# Patient Record
Sex: Male | Born: 1955 | Race: Black or African American | Hispanic: No | Marital: Married | Smoking: Former smoker
Health system: Southern US, Community
[De-identification: ages and names within clinical notes are randomized; demographics above are authoritative.]

## PROBLEM LIST (undated history)

## (undated) DIAGNOSIS — M549 Dorsalgia, unspecified: Secondary | ICD-10-CM

## (undated) DIAGNOSIS — G8929 Other chronic pain: Secondary | ICD-10-CM

## (undated) DIAGNOSIS — K259 Gastric ulcer, unspecified as acute or chronic, without hemorrhage or perforation: Secondary | ICD-10-CM

## (undated) DIAGNOSIS — R51 Headache: Secondary | ICD-10-CM

## (undated) DIAGNOSIS — R0600 Dyspnea, unspecified: Secondary | ICD-10-CM

## (undated) DIAGNOSIS — R519 Headache, unspecified: Secondary | ICD-10-CM

## (undated) DIAGNOSIS — I639 Cerebral infarction, unspecified: Secondary | ICD-10-CM

## (undated) DIAGNOSIS — N400 Enlarged prostate without lower urinary tract symptoms: Secondary | ICD-10-CM

## (undated) DIAGNOSIS — R569 Unspecified convulsions: Secondary | ICD-10-CM

## (undated) DIAGNOSIS — I10 Essential (primary) hypertension: Secondary | ICD-10-CM

## (undated) DIAGNOSIS — I509 Heart failure, unspecified: Secondary | ICD-10-CM

## (undated) HISTORY — PX: BACK SURGERY: SHX140

## (undated) HISTORY — DX: Other chronic pain: G89.29

## (undated) HISTORY — DX: Unspecified convulsions: R56.9

## (undated) HISTORY — DX: Headache, unspecified: R51.9

## (undated) HISTORY — DX: Headache: R51

---

## 2010-04-27 ENCOUNTER — Ambulatory Visit (HOSPITAL_COMMUNITY): Admission: RE | Admit: 2010-04-27 | Discharge: 2010-04-27 | Payer: Self-pay | Admitting: Urology

## 2013-08-08 HISTORY — PX: COLONOSCOPY: SHX174

## 2013-08-23 ENCOUNTER — Emergency Department (HOSPITAL_COMMUNITY)
Admission: EM | Admit: 2013-08-23 | Discharge: 2013-08-23 | Discharge status: Against Medical Advice | Attending: Emergency Medicine | Admitting: Emergency Medicine

## 2013-08-23 ENCOUNTER — Encounter (HOSPITAL_COMMUNITY): Payer: Self-pay | Admitting: Emergency Medicine

## 2013-08-23 DIAGNOSIS — K5900 Constipation, unspecified: Secondary | ICD-10-CM | POA: Insufficient documentation

## 2013-08-23 DIAGNOSIS — I10 Essential (primary) hypertension: Secondary | ICD-10-CM | POA: Insufficient documentation

## 2013-08-23 HISTORY — DX: Essential (primary) hypertension: I10

## 2013-08-23 HISTORY — DX: Dorsalgia, unspecified: M54.9

## 2013-08-23 HISTORY — DX: Cerebral infarction, unspecified: I63.9

## 2013-08-23 HISTORY — DX: Other chronic pain: G89.29

## 2013-08-23 HISTORY — DX: Gastric ulcer, unspecified as acute or chronic, without hemorrhage or perforation: K25.9

## 2013-08-23 HISTORY — DX: Benign prostatic hyperplasia without lower urinary tract symptoms: N40.0

## 2013-08-23 NOTE — ED Notes (Signed)
Pt reports constipation since Sunday. [facility] a [facility]

## 2013-08-23 NOTE — ED Notes (Addendum)
PT JUST HAD BM TONIGHT WHILE WAITING FOR A ROOM. PT NOW NOT WANTING TO BE SEEN.

## 2016-01-18 DIAGNOSIS — G932 Benign intracranial hypertension: Secondary | ICD-10-CM | POA: Insufficient documentation

## 2016-01-25 ENCOUNTER — Other Ambulatory Visit: Payer: Self-pay

## 2016-01-25 ENCOUNTER — Emergency Department (HOSPITAL_BASED_OUTPATIENT_CLINIC_OR_DEPARTMENT_OTHER)
Admission: EM | Admit: 2016-01-25 | Discharge: 2016-01-25 | Disposition: A | Discharge status: Home / Self Care | Payer: Medicare Other | Attending: Emergency Medicine | Admitting: Emergency Medicine

## 2016-01-25 ENCOUNTER — Emergency Department (HOSPITAL_BASED_OUTPATIENT_CLINIC_OR_DEPARTMENT_OTHER): Payer: Medicare Other

## 2016-01-25 ENCOUNTER — Encounter (HOSPITAL_BASED_OUTPATIENT_CLINIC_OR_DEPARTMENT_OTHER): Payer: Self-pay | Admitting: Emergency Medicine

## 2016-01-25 DIAGNOSIS — J019 Acute sinusitis, unspecified: Secondary | ICD-10-CM | POA: Diagnosis not present

## 2016-01-25 DIAGNOSIS — M79602 Pain in left arm: Secondary | ICD-10-CM | POA: Insufficient documentation

## 2016-01-25 DIAGNOSIS — Z79899 Other long term (current) drug therapy: Secondary | ICD-10-CM | POA: Insufficient documentation

## 2016-01-25 DIAGNOSIS — F17200 Nicotine dependence, unspecified, uncomplicated: Secondary | ICD-10-CM | POA: Diagnosis not present

## 2016-01-25 DIAGNOSIS — R200 Anesthesia of skin: Secondary | ICD-10-CM | POA: Diagnosis not present

## 2016-01-25 DIAGNOSIS — I10 Essential (primary) hypertension: Secondary | ICD-10-CM | POA: Insufficient documentation

## 2016-01-25 DIAGNOSIS — R05 Cough: Secondary | ICD-10-CM | POA: Diagnosis present

## 2016-01-25 LAB — BASIC METABOLIC PANEL
ANION GAP: 9 (ref 5–15)
BUN: 19 mg/dL (ref 6–20)
CALCIUM: 8.9 mg/dL (ref 8.9–10.3)
CO2: 26 mmol/L (ref 22–32)
CREATININE: 1.38 mg/dL — AB (ref 0.61–1.24)
Chloride: 104 mmol/L (ref 101–111)
GFR calc Af Amer: >60 mL/min (ref >60)
GFR calc non Af Amer: 55 mL/min — ABNORMAL LOW (ref >60)
GLUCOSE: 96 mg/dL (ref 65–99)
Potassium: 3.6 mmol/L (ref 3.5–5.1)
Sodium: 139 mmol/L (ref 135–145)

## 2016-01-25 LAB — CBC
HCT: 41.3 % (ref 39.0–52.0)
HEMOGLOBIN: 14 g/dL (ref 13.0–17.0)
MCH: 26.7 pg (ref 26.0–34.0)
MCHC: 33.9 g/dL (ref 30.0–36.0)
MCV: 78.8 fL (ref 78.0–100.0)
Platelets: 280 10*3/uL (ref 150–400)
RBC: 5.24 MIL/uL (ref 4.22–5.81)
RDW: 14.6 % (ref 11.5–15.5)
WBC: 8.2 10*3/uL (ref 4.0–10.5)

## 2016-01-25 LAB — TROPONIN I

## 2016-01-25 MED ORDER — DEXAMETHASONE 6 MG PO TABS
10.0000 mg | ORAL_TABLET | Freq: Once | ORAL | Status: AC
  Administered 2016-01-25: 10 mg via ORAL
  Filled 2016-01-25: qty 1

## 2016-01-25 MED ORDER — AZITHROMYCIN 250 MG PO TABS: 250.0000 mg | ORAL_TABLET | Freq: Every day | ORAL | Status: DC

## 2016-01-25 NOTE — Discharge Instructions (Signed)

## 2016-01-25 NOTE — ED Notes (Signed)
Pt c/o left arm pain that radiates into jaw that started today.  C/o sore throat for 2-3 weeks

## 2016-01-25 NOTE — ED Provider Notes (Signed)
CSN: [id number]     Arrival date & time 01/25/16  2043 History  By signing my name below, I, Rowan Blase, attest that this documentation has been prepared under the direction and in the presence of Leo Grosser, MD . Electronically Signed: Rowan Blase, Scribe. 01/25/2016. 10:11 PM.   Chief Complaint  Patient presents with  . Arm Pain    The history is provided by the patient. No language interpreter was used.   HPI Comments:  Donal Rohal is a 60 y.o. male with PMHx of HTN and stroke who presents to the Emergency Department complaining of left arm pain onset this morning. Pt reports associated tingling in left hand, left ear pain worsening with swallowing, sore throat, congestion and cough productive of green mucous. He notes difficulty breathing, worse when laying down. Pt has taken generic Zyrtec, Robitussin PM, Alka Seltzer Plus and Flonase. Wife notes pt's snoring has worsened recently. Pt smokes 5-6 cigarettes a day and states he is trying to quit.   Past Medical History  Diagnosis Date  . Stroke ([place])   . Hypertension   . Prostate enlargement   . Chronic back pain   . Gastric ulcer    History reviewed. No pertinent past surgical history. History reviewed. No pertinent family history. Social History  Substance Use Topics  . Smoking status: Current Every Day Smoker  . Smokeless tobacco: None  . Alcohol Use: No    Review of Systems  HENT: Positive for congestion, ear pain and sore throat.   Respiratory: Positive for cough and shortness of breath.   Musculoskeletal: Positive for arthralgias.  Neurological: Positive for numbness (tingling).  All other systems reviewed and are negative.   Allergies  Penicillins  Home Medications   Prior to Admission medications   Medication Sig Start Date End Date Taking? Authorizing Provider  acetaZOLAMIDE (DIAMOX) 500 MG capsule Take 500 mg by mouth 2 (two) times daily.   Yes Historical Provider, MD  amLODipine (NORVASC) 5  MG tablet Take 5 mg by mouth daily.   Yes Historical Provider, MD  atorvastatin (LIPITOR) 20 MG tablet Take 20 mg by mouth daily.   Yes Historical Provider, MD  Butalbital-APAP-Caffeine (FIORICET) 50-300-40 MG CAPS Take by mouth.   Yes Historical Provider, MD  fluticasone (FLONASE) 50 MCG/ACT nasal spray Place into both nostrils daily.   Yes Historical Provider, MD  hydrOXYzine (ATARAX/VISTARIL) 25 MG tablet Take by mouth 3 (three) times daily as needed.   Yes Historical Provider, MD  nortriptyline (PAMELOR) 10 MG capsule Take 10 mg by mouth at bedtime.   Yes Historical Provider, MD  tamsulosin (FLOMAX) 0.4 MG CAPS capsule Take 0.4 mg by mouth.   Yes Historical Provider, MD  triamterene-hydrochlorothiazide (DYAZIDE) 37.5-25 MG capsule Take 1 capsule by mouth daily.   Yes Historical Provider, MD   BP 191/122 mmHg  Pulse 90  Temp(Src) 98.6 F (37 C) (Oral)  Resp 18  Ht 5' 8.5" (1.74 m)  Wt 218 lb (98.884 kg)  BMI 32.66 kg/m2  SpO2 98%   Physical Exam  Constitutional: He is oriented to person, place, and time. He appears well-developed and well-nourished. No distress.  HENT:  Head: Normocephalic and atraumatic.  Eyes: Conjunctivae are normal.  Neck: Neck supple. No tracheal deviation present.  Cardiovascular: Normal rate, regular rhythm and normal heart sounds.  Exam reveals no gallop and no friction rub.   No murmur heard. Pulmonary/Chest: Effort normal and breath sounds normal. No respiratory distress. He has no wheezes. He has no  rales.  Abdominal: Soft. He exhibits no distension.  Musculoskeletal:  Left arm pain is positional in nature.  Neurological: He is alert and oriented to person, place, and time.  Full strength and sensation BL  Skin: Skin is warm and dry.  Psychiatric: He has a normal mood and affect.    ED Course  Procedures  DIAGNOSTIC STUDIES:  Oxygen Saturation is 98% on RA, normal by my interpretation.    COORDINATION OF CARE:  10:03 PM Will administer  steroids and discharge with Z-pack. Follow-up with PCP in 1 week. Discussed treatment plan with pt at bedside and pt agreed to plan.  Labs Review Labs Reviewed  BASIC METABOLIC PANEL - Abnormal; Notable for the following:    Creatinine, Ser 1.38 (*)    GFR calc non Af Amer 55 (*)    All other components within normal limits  CBC  TROPONIN I    Imaging Review Dg Chest 2 View  01/25/2016  CLINICAL DATA:  Acute onset of left arm pain. Sore throat and cough. Bilateral ear and jaw pain. Initial encounter. EXAM: CHEST  2 VIEW COMPARISON:  CTA of the chest and chest radiograph performed 10/21/2012 FINDINGS: The lungs are well-aerated. Mild bibasilar opacities may reflect atelectasis or possibly mild pneumonia. There is no evidence of pleural effusion or pneumothorax. The heart is normal in size; the mediastinal contour is within normal limits. No acute osseous abnormalities are seen. IMPRESSION: Mild bibasilar airspace opacities may reflect atelectasis or possibly mild pneumonia. Electronically Signed   By: Garald Balding M.D.   On: 01/25/2016 21:41   I have personally reviewed and evaluated these images and lab results as part of my medical decision-making.   EKG Interpretation   Date/Time:  Monday January 25 2016 20:52:45 EDT Ventricular Rate:  88 PR Interval:  174 QRS Duration: 90 QT Interval:  354 QTC Calculation: 428 R Axis:   -7 Text Interpretation:  Normal sinus rhythm Normal ECG No previous tracing  Confirmed by Nickolis Diel MD, Quillian Quince [id number]) on 01/25/2016 8:54:43 PM      MDM   Final diagnoses:  Acute sinusitis, recurrence not specified, unspecified location    60 y.o. male presents with multiple complaints the most pressing of which is his sinus congestion that has been refractory to antihistamines and steroid nasal spray. It is interfering with his sleep and his post-nasal drip is causing him a great deal of throat discomfort. Pt given decadron for symptomatic relief. Will plan for  azithro with ongoing cough in a smoker and questionable XR findings of atypical pneumonia. His arm pain appears MSK in nature over the triceps and screening cardiac workup from triage is negative, highly atypical for ACS. Advised that smoking cessation would be highly beneficial. Plan to follow up with PCP as needed and return precautions discussed for worsening or new concerning symptoms.   I personally performed the services described in this documentation, which was scribed in my presence. The recorded information has been reviewed and is accurate.     Leo Grosser, MD 01/26/16 [phone]

## 2017-04-19 DIAGNOSIS — G459 Transient cerebral ischemic attack, unspecified: Secondary | ICD-10-CM | POA: Insufficient documentation

## 2017-04-19 DIAGNOSIS — Z8669 Personal history of other diseases of the nervous system and sense organs: Secondary | ICD-10-CM | POA: Insufficient documentation

## 2017-05-10 DIAGNOSIS — R002 Palpitations: Secondary | ICD-10-CM | POA: Insufficient documentation

## 2017-12-31 ENCOUNTER — Emergency Department (HOSPITAL_BASED_OUTPATIENT_CLINIC_OR_DEPARTMENT_OTHER): Payer: Medicare Other

## 2017-12-31 ENCOUNTER — Inpatient Hospital Stay (HOSPITAL_BASED_OUTPATIENT_CLINIC_OR_DEPARTMENT_OTHER)
Admission: EM | Admit: 2017-12-31 | Discharge: 2018-01-03 | DRG: 291 | Disposition: A | Discharge status: Home / Self Care | Payer: Medicare Other | Attending: Internal Medicine | Admitting: Internal Medicine

## 2017-12-31 ENCOUNTER — Inpatient Hospital Stay (HOSPITAL_COMMUNITY): Payer: Medicare Other

## 2017-12-31 ENCOUNTER — Encounter (HOSPITAL_BASED_OUTPATIENT_CLINIC_OR_DEPARTMENT_OTHER): Payer: Self-pay | Admitting: *Deleted

## 2017-12-31 ENCOUNTER — Other Ambulatory Visit: Payer: Self-pay

## 2017-12-31 DIAGNOSIS — N179 Acute kidney failure, unspecified: Secondary | ICD-10-CM | POA: Diagnosis not present

## 2017-12-31 DIAGNOSIS — Z9981 Dependence on supplemental oxygen: Secondary | ICD-10-CM | POA: Diagnosis not present

## 2017-12-31 DIAGNOSIS — Z79899 Other long term (current) drug therapy: Secondary | ICD-10-CM

## 2017-12-31 DIAGNOSIS — I427 Cardiomyopathy due to drug and external agent: Secondary | ICD-10-CM | POA: Diagnosis present

## 2017-12-31 DIAGNOSIS — H93A9 Pulsatile tinnitus, unspecified ear: Secondary | ICD-10-CM | POA: Diagnosis present

## 2017-12-31 DIAGNOSIS — D1809 Hemangioma of other sites: Secondary | ICD-10-CM | POA: Diagnosis present

## 2017-12-31 DIAGNOSIS — I5021 Acute systolic (congestive) heart failure: Secondary | ICD-10-CM | POA: Diagnosis present

## 2017-12-31 DIAGNOSIS — N289 Disorder of kidney and ureter, unspecified: Secondary | ICD-10-CM

## 2017-12-31 DIAGNOSIS — M549 Dorsalgia, unspecified: Secondary | ICD-10-CM | POA: Diagnosis present

## 2017-12-31 DIAGNOSIS — K761 Chronic passive congestion of liver: Secondary | ICD-10-CM | POA: Diagnosis present

## 2017-12-31 DIAGNOSIS — J439 Emphysema, unspecified: Secondary | ICD-10-CM | POA: Diagnosis present

## 2017-12-31 DIAGNOSIS — I10 Essential (primary) hypertension: Secondary | ICD-10-CM

## 2017-12-31 DIAGNOSIS — Z91041 Radiographic dye allergy status: Secondary | ICD-10-CM

## 2017-12-31 DIAGNOSIS — N183 Chronic kidney disease, stage 3 unspecified: Secondary | ICD-10-CM

## 2017-12-31 DIAGNOSIS — I5041 Acute combined systolic (congestive) and diastolic (congestive) heart failure: Secondary | ICD-10-CM | POA: Diagnosis not present

## 2017-12-31 DIAGNOSIS — E785 Hyperlipidemia, unspecified: Secondary | ICD-10-CM | POA: Diagnosis present

## 2017-12-31 DIAGNOSIS — R740 Nonspecific elevation of levels of transaminase and lactic acid dehydrogenase [LDH]: Secondary | ICD-10-CM | POA: Diagnosis present

## 2017-12-31 DIAGNOSIS — R51 Headache: Secondary | ICD-10-CM | POA: Diagnosis present

## 2017-12-31 DIAGNOSIS — I130 Hypertensive heart and chronic kidney disease with heart failure and stage 1 through stage 4 chronic kidney disease, or unspecified chronic kidney disease: Secondary | ICD-10-CM | POA: Diagnosis present

## 2017-12-31 DIAGNOSIS — N170 Acute kidney failure with tubular necrosis: Secondary | ICD-10-CM | POA: Diagnosis not present

## 2017-12-31 DIAGNOSIS — Z8673 Personal history of transient ischemic attack (TIA), and cerebral infarction without residual deficits: Secondary | ICD-10-CM

## 2017-12-31 DIAGNOSIS — I428 Other cardiomyopathies: Secondary | ICD-10-CM | POA: Diagnosis present

## 2017-12-31 DIAGNOSIS — Z888 Allergy status to other drugs, medicaments and biological substances status: Secondary | ICD-10-CM

## 2017-12-31 DIAGNOSIS — I5020 Unspecified systolic (congestive) heart failure: Secondary | ICD-10-CM

## 2017-12-31 DIAGNOSIS — R7989 Other specified abnormal findings of blood chemistry: Secondary | ICD-10-CM

## 2017-12-31 DIAGNOSIS — R0602 Shortness of breath: Secondary | ICD-10-CM

## 2017-12-31 DIAGNOSIS — Z720 Tobacco use: Secondary | ICD-10-CM | POA: Diagnosis not present

## 2017-12-31 DIAGNOSIS — Z8711 Personal history of peptic ulcer disease: Secondary | ICD-10-CM

## 2017-12-31 DIAGNOSIS — I313 Pericardial effusion (noninflammatory): Secondary | ICD-10-CM | POA: Diagnosis present

## 2017-12-31 DIAGNOSIS — R945 Abnormal results of liver function studies: Secondary | ICD-10-CM | POA: Diagnosis present

## 2017-12-31 DIAGNOSIS — F1910 Other psychoactive substance abuse, uncomplicated: Secondary | ICD-10-CM | POA: Diagnosis present

## 2017-12-31 DIAGNOSIS — R0902 Hypoxemia: Secondary | ICD-10-CM | POA: Diagnosis present

## 2017-12-31 DIAGNOSIS — G8929 Other chronic pain: Secondary | ICD-10-CM | POA: Diagnosis present

## 2017-12-31 DIAGNOSIS — F17200 Nicotine dependence, unspecified, uncomplicated: Secondary | ICD-10-CM | POA: Diagnosis present

## 2017-12-31 DIAGNOSIS — F1410 Cocaine abuse, uncomplicated: Secondary | ICD-10-CM | POA: Diagnosis present

## 2017-12-31 DIAGNOSIS — J9811 Atelectasis: Secondary | ICD-10-CM | POA: Diagnosis present

## 2017-12-31 DIAGNOSIS — N400 Enlarged prostate without lower urinary tract symptoms: Secondary | ICD-10-CM

## 2017-12-31 DIAGNOSIS — R160 Hepatomegaly, not elsewhere classified: Secondary | ICD-10-CM | POA: Diagnosis present

## 2017-12-31 DIAGNOSIS — N184 Chronic kidney disease, stage 4 (severe): Secondary | ICD-10-CM | POA: Diagnosis not present

## 2017-12-31 DIAGNOSIS — I509 Heart failure, unspecified: Secondary | ICD-10-CM | POA: Diagnosis not present

## 2017-12-31 DIAGNOSIS — Z885 Allergy status to narcotic agent status: Secondary | ICD-10-CM

## 2017-12-31 LAB — BASIC METABOLIC PANEL
Anion gap: 10 (ref 5–15)
BUN: 34 mg/dL — ABNORMAL HIGH (ref 6–20)
CALCIUM: 8.5 mg/dL — AB (ref 8.9–10.3)
CO2: 19 mmol/L — ABNORMAL LOW (ref 22–32)
CREATININE: 1.4 mg/dL — AB (ref 0.61–1.24)
Chloride: 111 mmol/L (ref 101–111)
GFR calc Af Amer: >60 mL/min (ref >60)
GFR, EST NON AFRICAN AMERICAN: 54 mL/min — AB (ref >60)
Glucose, Bld: 111 mg/dL — ABNORMAL HIGH (ref 65–99)
Potassium: 3.8 mmol/L (ref 3.5–5.1)
SODIUM: 140 mmol/L (ref 135–145)

## 2017-12-31 LAB — RAPID URINE DRUG SCREEN, HOSP PERFORMED
Amphetamines: NOT DETECTED
Barbiturates: NOT DETECTED
Benzodiazepines: NOT DETECTED
Cocaine: POSITIVE — AB
OPIATES: NOT DETECTED
TETRAHYDROCANNABINOL: NOT DETECTED

## 2017-12-31 LAB — CREATININE, SERUM
CREATININE: 1.56 mg/dL — AB (ref 0.61–1.24)
GFR calc Af Amer: 54 mL/min — ABNORMAL LOW (ref >60)
GFR, EST NON AFRICAN AMERICAN: 47 mL/min — AB (ref >60)

## 2017-12-31 LAB — CBC WITH DIFFERENTIAL/PLATELET
BASOS ABS: 0 10*3/uL (ref 0.0–0.1)
Basophils Relative: 0 %
Eosinophils Absolute: 0.1 10*3/uL (ref 0.0–0.7)
Eosinophils Relative: 2 %
HEMATOCRIT: 38.1 % — AB (ref 39.0–52.0)
HEMOGLOBIN: 13.3 g/dL (ref 13.0–17.0)
LYMPHS PCT: 22 %
Lymphs Abs: 1.1 10*3/uL (ref 0.7–4.0)
MCH: 27.5 pg (ref 26.0–34.0)
MCHC: 34.9 g/dL (ref 30.0–36.0)
MCV: 78.9 fL (ref 78.0–100.0)
MONO ABS: 0.4 10*3/uL (ref 0.1–1.0)
Monocytes Relative: 9 %
NEUTROS ABS: 3.4 10*3/uL (ref 1.7–7.7)
NEUTROS PCT: 67 %
Platelets: 249 10*3/uL (ref 150–400)
RBC: 4.83 MIL/uL (ref 4.22–5.81)
RDW: 14.2 % (ref 11.5–15.5)
WBC: 5.1 10*3/uL (ref 4.0–10.5)

## 2017-12-31 LAB — TROPONIN I
TROPONIN I: 0.03 ng/mL — AB (ref <0.03)
TROPONIN I: 0.03 ng/mL — AB (ref <0.03)
Troponin I: 0.03 ng/mL (ref <0.03)
Troponin I: 0.03 ng/mL (ref <0.03)

## 2017-12-31 LAB — COMPREHENSIVE METABOLIC PANEL
ALT: 71 U/L — ABNORMAL HIGH (ref 17–63)
AST: 44 U/L — ABNORMAL HIGH (ref 15–41)
Albumin: 2.9 g/dL — ABNORMAL LOW (ref 3.5–5.0)
Alkaline Phosphatase: 65 U/L (ref 38–126)
Anion gap: 9 (ref 5–15)
BUN: 31 mg/dL — AB (ref 6–20)
CHLORIDE: 111 mmol/L (ref 101–111)
CO2: 24 mmol/L (ref 22–32)
Calcium: 8.6 mg/dL — ABNORMAL LOW (ref 8.9–10.3)
Creatinine, Ser: 1.69 mg/dL — ABNORMAL HIGH (ref 0.61–1.24)
GFR calc Af Amer: 49 mL/min — ABNORMAL LOW (ref >60)
GFR calc non Af Amer: 43 mL/min — ABNORMAL LOW (ref >60)
GLUCOSE: 101 mg/dL — AB (ref 65–99)
POTASSIUM: 3.7 mmol/L (ref 3.5–5.1)
SODIUM: 144 mmol/L (ref 135–145)
Total Bilirubin: 0.4 mg/dL (ref 0.3–1.2)
Total Protein: 6.2 g/dL — ABNORMAL LOW (ref 6.5–8.1)

## 2017-12-31 LAB — CBC
HCT: 41.3 % (ref 39.0–52.0)
HEMOGLOBIN: 13.7 g/dL (ref 13.0–17.0)
MCH: 27 pg (ref 26.0–34.0)
MCHC: 33.2 g/dL (ref 30.0–36.0)
MCV: 81.5 fL (ref 78.0–100.0)
PLATELETS: 256 10*3/uL (ref 150–400)
RBC: 5.07 MIL/uL (ref 4.22–5.81)
RDW: 14.1 % (ref 11.5–15.5)
WBC: 5.9 10*3/uL (ref 4.0–10.5)

## 2017-12-31 LAB — MAGNESIUM: MAGNESIUM: 2.1 mg/dL (ref 1.7–2.4)

## 2017-12-31 LAB — TSH: TSH: 1.178 u[IU]/mL (ref 0.350–4.500)

## 2017-12-31 LAB — PHOSPHORUS: PHOSPHORUS: 3.9 mg/dL (ref 2.5–4.6)

## 2017-12-31 LAB — BRAIN NATRIURETIC PEPTIDE: B NATRIURETIC PEPTIDE 5: 928 pg/mL — AB (ref 0.0–100.0)

## 2017-12-31 MED ORDER — TAMSULOSIN HCL 0.4 MG PO CAPS
0.4000 mg | ORAL_CAPSULE | Freq: Every day | ORAL | Status: DC
  Administered 2017-12-31 – 2018-01-02 (×3): 0.4 mg via ORAL
  Filled 2017-12-31 (×3): qty 1

## 2017-12-31 MED ORDER — NITROGLYCERIN 2 % TD OINT
1.0000 [in_us] | TOPICAL_OINTMENT | Freq: Once | TRANSDERMAL | Status: AC
  Administered 2017-12-31: 1 [in_us] via TOPICAL
  Filled 2017-12-31: qty 1

## 2017-12-31 MED ORDER — ONDANSETRON HCL 4 MG/2ML IJ SOLN: 4.0000 mg | Freq: Four times a day (QID) | INTRAMUSCULAR | Status: DC | PRN

## 2017-12-31 MED ORDER — ACETAMINOPHEN 325 MG PO TABS
650.0000 mg | ORAL_TABLET | ORAL | Status: DC | PRN
  Administered 2017-12-31 – 2018-01-02 (×2): 650 mg via ORAL
  Filled 2017-12-31 (×2): qty 2

## 2017-12-31 MED ORDER — TRIAMTERENE-HCTZ 37.5-25 MG PO CAPS
1.0000 | ORAL_CAPSULE | Freq: Every day | ORAL | Status: DC
  Filled 2017-12-31: qty 1

## 2017-12-31 MED ORDER — HEPARIN SODIUM (PORCINE) 5000 UNIT/ML IJ SOLN
5000.0000 [IU] | Freq: Three times a day (TID) | INTRAMUSCULAR | Status: DC
  Administered 2017-12-31: 5000 [IU] via SUBCUTANEOUS
  Filled 2017-12-31 (×6): qty 1

## 2017-12-31 MED ORDER — HYDROXYZINE HCL 25 MG PO TABS
25.0000 mg | ORAL_TABLET | Freq: Three times a day (TID) | ORAL | Status: DC | PRN
  Administered 2017-12-31: 25 mg via ORAL
  Filled 2017-12-31: qty 1

## 2017-12-31 MED ORDER — SODIUM CHLORIDE 0.9% FLUSH
3.0000 mL | Freq: Two times a day (BID) | INTRAVENOUS | Status: DC
  Administered 2017-12-31 – 2018-01-03 (×6): 3 mL via INTRAVENOUS

## 2017-12-31 MED ORDER — FUROSEMIDE 10 MG/ML IJ SOLN
40.0000 mg | Freq: Two times a day (BID) | INTRAMUSCULAR | Status: DC
  Administered 2018-01-01 (×2): 40 mg via INTRAVENOUS
  Filled 2017-12-31 (×2): qty 4

## 2017-12-31 MED ORDER — SODIUM CHLORIDE 0.9% FLUSH: 3.0000 mL | INTRAVENOUS | Status: DC | PRN

## 2017-12-31 MED ORDER — NORTRIPTYLINE HCL 10 MG PO CAPS
10.0000 mg | ORAL_CAPSULE | Freq: Every day | ORAL | Status: DC
  Administered 2017-12-31 – 2018-01-02 (×2): 10 mg via ORAL
  Filled 2017-12-31 (×3): qty 1

## 2017-12-31 MED ORDER — NITROGLYCERIN 0.4 MG SL SUBL: 0.4000 mg | SUBLINGUAL_TABLET | SUBLINGUAL | Status: DC | PRN

## 2017-12-31 MED ORDER — ATORVASTATIN CALCIUM 20 MG PO TABS
20.0000 mg | ORAL_TABLET | Freq: Every day | ORAL | Status: DC
  Administered 2017-12-31 – 2018-01-02 (×3): 20 mg via ORAL
  Filled 2017-12-31 (×3): qty 1

## 2017-12-31 MED ORDER — AMLODIPINE BESYLATE 5 MG PO TABS
5.0000 mg | ORAL_TABLET | Freq: Every day | ORAL | Status: DC
  Administered 2017-12-31 – 2018-01-01 (×2): 5 mg via ORAL
  Filled 2017-12-31 (×3): qty 1

## 2017-12-31 MED ORDER — ACETAZOLAMIDE ER 500 MG PO CP12
500.0000 mg | ORAL_CAPSULE | Freq: Two times a day (BID) | ORAL | Status: DC
  Administered 2017-12-31 – 2018-01-01 (×3): 500 mg via ORAL
  Filled 2017-12-31 (×3): qty 1

## 2017-12-31 MED ORDER — FUROSEMIDE 10 MG/ML IJ SOLN
40.0000 mg | Freq: Once | INTRAMUSCULAR | Status: AC
  Administered 2017-12-31: 40 mg via INTRAVENOUS
  Filled 2017-12-31: qty 4

## 2017-12-31 MED ORDER — FLUTICASONE PROPIONATE 50 MCG/ACT NA SUSP
1.0000 | Freq: Every day | NASAL | Status: DC
  Administered 2017-12-31 – 2018-01-02 (×2): 1 via NASAL
  Filled 2017-12-31: qty 16

## 2017-12-31 MED ORDER — SODIUM CHLORIDE 0.9 % IV SOLN: 250.0000 mL | INTRAVENOUS | Status: DC | PRN

## 2017-12-31 MED ORDER — OXYCODONE HCL 5 MG PO TABS
5.0000 mg | ORAL_TABLET | Freq: Four times a day (QID) | ORAL | Status: DC | PRN
  Filled 2017-12-31 (×2): qty 1

## 2017-12-31 NOTE — Progress Notes (Signed)
Pt from [facility] ED with new onset CHF (presented SOB/ orthopnea), exam, BNP and CXR c/w CHF.  No prior history but per ED MD did have a low EF by recent nuclear cardiac stress done in [facility].  Hx HTN, BP's high, pt not in distress. No beds at [facility], pt accepted for [facility] telemetry bed.    Kelly Splinter MD [facility] pgr [phone] 12/31/2017, 7:58 AM

## 2017-12-31 NOTE — ED Triage Notes (Signed)
C/o feeling sob and chest feeling "heavy" that started a week ago but worse tonight. States drug use on Friday. States he smoked some crack. Pt presents with shortness of breath able to speak complete sentences. Pt with nasal congestion. Denies any fevers. Also c/o left arm pain "inside" pt sinus on tele.

## 2017-12-31 NOTE — ED Notes (Signed)
Pt brought to ED in cab. States he has been SHOB x 1 week. Has been having to sleep sitting up for about the same. Also c/o prod cough with thick pink tinged sputum per pt. Congested. Admits to drug use on Friday. "Smoked base". Restless. BBS-diminished with rales noted. ?gallop auscultated, but difficult to determine d/t pt forced expiration. Also c/o left arm pain.

## 2017-12-31 NOTE — ED Notes (Signed)
Date and time results received: 12/31/17 0720 (use smart  Test: trp Critical Value: 0.03 Name of Provider Notified:Floyd Orders Received? Or Actions Taken?: no orders given

## 2017-12-31 NOTE — ED Provider Notes (Signed)
[facility] EMERGENCY DEPARTMENT Provider Note   CSN: [id number] Arrival date & time: 12/31/17  0604     History   Chief Complaint Chief Complaint  Patient presents with  . Shortness of Breath    HPI Terrence Garcia is a 62 y.o. male.  62 yo M with a chief complaint of shortness of breath is been going on about a week.  The patient started doing cocaine again feels that that had made it much worse.  Worse with lying flat he feels that he is a little swollen about his abdomen.  He has been trying to lay back to sleep but wakes up and feels that he is having trouble catching his breath.  Having some worsening shortness of breath when he gets up and moves around as well.  He denies cough congestion or fever.  The history is provided by the patient.  Shortness of Breath  This is a new problem. The average episode lasts 1 week. The problem occurs continuously.The current episode started more than 2 days ago. The problem has been gradually worsening. Pertinent negatives include no fever, no headaches, no chest pain, no vomiting, no abdominal pain and no rash. He has tried nothing for the symptoms. The treatment provided no relief. He has had no prior hospitalizations. He has had no prior ED visits. Associated medical issues include heart failure.    Past Medical History:  Diagnosis Date  . Chronic back pain   . Gastric ulcer   . Hypertension   . Prostate enlargement   . Stroke [facility])     There are no active problems to display for this patient.   Past Surgical History:  Procedure Laterality Date  . BACK SURGERY          Home Medications    Prior to Admission medications   Medication Sig Start Date End Date Taking? Authorizing Provider  acetaZOLAMIDE (DIAMOX) 500 MG capsule Take 500 mg by mouth 2 (two) times daily.    [provider]  amLODipine (NORVASC) 5 MG tablet Take 5 mg by mouth daily.    [provider]  atorvastatin (LIPITOR) 20 MG  tablet Take 20 mg by mouth daily.    [provider]  azithromycin (ZITHROMAX) 250 MG tablet Take 1 tablet (250 mg total) by mouth daily. Take first 2 tablets together, then 1 every day until finished. 01/25/16   Leo Grosser, MD  Butalbital-APAP-Caffeine (FIORICET) 50-300-40 MG CAPS Take by mouth.    [provider]  fluticasone (FLONASE) 50 MCG/ACT nasal spray Place into both nostrils daily.    [provider]  hydrOXYzine (ATARAX/VISTARIL) 25 MG tablet Take by mouth 3 (three) times daily as needed.    [provider]  nortriptyline (PAMELOR) 10 MG capsule Take 10 mg by mouth at bedtime.    [provider]  tamsulosin (FLOMAX) 0.4 MG CAPS capsule Take 0.4 mg by mouth.    [provider]  triamterene-hydrochlorothiazide (DYAZIDE) 37.5-25 MG capsule Take 1 capsule by mouth daily.    [provider]    Family History No family history on file.  Social History Social History   Tobacco Use  . Smoking status: Current Every Day Smoker  . Smokeless tobacco: Never Used  Substance Use Topics  . Alcohol use: No  . 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  HENT: Negative for congestion and facial swelling.   Eyes: Negative for discharge and visual disturbance.  Respiratory: Positive for shortness of breath.   Cardiovascular: Negative for chest pain and palpitations.  Gastrointestinal: Negative for abdominal pain, diarrhea and vomiting.  Musculoskeletal: Negative for arthralgias and myalgias.  Skin: Negative for color change and rash.  Neurological: Negative for tremors, syncope and headaches.  Psychiatric/Behavioral: Negative for confusion and dysphoric mood.     Physical Exam Updated Vital Signs BP (!) 146/112   Pulse 89   Temp 98.2 F (36.8 C) (Oral)   Resp (!)  21   Ht 5\' 8"  (1.727 m)   Wt 86.5 kg (190 lb 12.8 oz)   SpO2 (!) 87%   BMI 29.01 kg/m   Physical Exam  Constitutional: He is oriented to person, place, and time. He appears well-developed and well-nourished.  HENT:  Head: Normocephalic and atraumatic.  Eyes: Pupils are equal, round, and reactive to light. EOM are normal.  Neck: Normal range of motion. Neck supple. JVD (to angle of jaw) present.  Cardiovascular: Normal rate and regular rhythm. Exam reveals no gallop and no friction rub.  No murmur heard. Pulmonary/Chest: No respiratory distress. He has no wheezes. He has rales (up to mid back).  Abdominal: He exhibits no distension. There is no rebound and no guarding.  Musculoskeletal: Normal range of motion. He exhibits edema (trace bilaterally).  Neurological: He is alert and oriented to person, place, and time.  Skin: No rash noted. No pallor.  Psychiatric: He has a normal mood and affect. His behavior is normal.  Nursing note and vitals reviewed.    ED Treatments / Results  Labs (all labs ordered are listed, but only abnormal results are displayed) Labs Reviewed  CBC WITH DIFFERENTIAL/PLATELET - Abnormal; Notable for the following components:      Result Value   HCT 38.1 (*)    All other components within normal limits  BASIC METABOLIC PANEL - Abnormal; Notable for the following components:   CO2 19 (*)    Glucose, Bld 111 (*)    BUN 34 (*)    Creatinine, Ser 1.40 (*)    Calcium 8.5 (*)    GFR calc non Af Amer 54 (*)    All other components within normal limits  TROPONIN I - Abnormal; Notable for the following components:   Troponin I 0.03 (*)    All other components within normal limits  BRAIN NATRIURETIC PEPTIDE - Abnormal; Notable for the following components:   B Natriuretic Peptide 928.0 (*)    All other components within normal limits  RAPID URINE DRUG SCREEN, HOSP PERFORMED - Abnormal; Notable for the following components:   Cocaine POSITIVE (*)    All  other components within normal limits    EKG EKG Interpretation  Date/Time:  Sunday Dec 31 2017 06:10:17 EDT Ventricular Rate:  96 PR Interval:    QRS Duration: 91 QT Interval:  355 QTC Calculation: 449 R Axis:   16 Text Interpretation:  Sinus rhythm Ventricular premature complex non-specific st T changes Confirmed by Dory Horn) on 12/31/2017 6:40:23 AM   Radiology Dg Chest Port 1 View  Result Date: 12/31/2017 CLINICAL DATA:  Shortness of breath EXAM: PORTABLE CHEST 1 VIEW COMPARISON:  04/19/2017 FINDINGS: Cardiac enlargement. Pulmonary vascular congestion. Perihilar and basal infiltrates consistent with edema. No focal consolidation or volume loss. Small amount of fluid in the fissure. No blunting of costophrenic  angles. No pneumothorax. Mediastinal contours appear intact. IMPRESSION: Cardiac enlargement with pulmonary vascular congestion and bilateral edema. Mild fluid in the fissures. Electronically Signed   By: Lucienne Capers M.D.   On: 12/31/2017 06:41    Procedures Procedures (including critical care time)  Medications Ordered in ED Medications  amLODipine (NORVASC) tablet 5 mg (5 mg Oral Given 12/31/17 0752)  triamterene-hydrochlorothiazide (DYAZIDE) 37.5-25 MG per capsule 1 capsule (has no administration in time range)  nitroGLYCERIN (NITROSTAT) SL tablet 0.4 mg (has no administration in time range)  furosemide (LASIX) injection 40 mg (40 mg Intravenous Given 12/31/17 0752)  nitroGLYCERIN (NITROGLYN) 2 % ointment 1 inch (1 inch Topical Given 12/31/17 0752)     Initial Impression / Assessment and Plan / ED Course  I have reviewed the triage vital signs and the nursing notes.  Pertinent labs & imaging results that were available during my care of the patient were reviewed by me and considered in my medical decision making (see chart for details).     62 yo M with a chief complaint of shortness of breath.  Patient has orthopnea and PND, clinically has heart  failure on my exam.  Chest x-ray with bilateral edema as viewed by me.  Patient is tachypneic and uncomfortable at rest.  Feel that he likely needs to be admitted.  His old records were reviewed and I see no prior heart failure diagnosis, though at his most recent hospitalization he had a nuclear stress test with an estimated EF of 32%. Will give 40 lasix. Will discuss with hospitalist for admission.   CRITICAL CARE Performed by: Cecilio Asper   Total critical care time: 35 minutes  Critical care time was exclusive of separately billable procedures and treating other patients.  Critical care was necessary to treat or prevent imminent or life-threatening deterioration.  Critical care was time spent personally by me on the following activities: development of treatment plan with patient and/or surrogate as well as nursing, discussions with consultants, evaluation of patient's response to treatment, examination of patient, obtaining history from patient or surrogate, ordering and performing treatments and interventions, ordering and review of laboratory studies, ordering and review of radiographic studies, pulse oximetry and re-evaluation of patient's condition.  The patients results and plan were reviewed and discussed.   Any x-rays performed were independently reviewed by myself.   Differential diagnosis were considered with the presenting HPI.  Medications  amLODipine (NORVASC) tablet 5 mg (5 mg Oral Given 12/31/17 0752)  triamterene-hydrochlorothiazide (DYAZIDE) 37.5-25 MG per capsule 1 capsule (has no administration in time range)  nitroGLYCERIN (NITROSTAT) SL tablet 0.4 mg (has no administration in time range)  furosemide (LASIX) injection 40 mg (40 mg Intravenous Given 12/31/17 0752)  nitroGLYCERIN (NITROGLYN) 2 % ointment 1 inch (1 inch Topical Given 12/31/17 0752)    Vitals:   12/31/17 0615 12/31/17 0730  BP: (!) 137/115 (!) 146/112  Pulse: 95 89  Resp: (!) 29 (!) 21  Temp:  98.2 F (36.8 C)   TempSrc: Oral   SpO2: 96% (!) 87%  Weight: 86.5 kg (190 lb 12.8 oz)   Height: 5\' 8"  (1.727 m)     Final diagnoses:  Congestive heart failure with left ventricular systolic dysfunction (HCC)    Admission/ observation were discussed with the admitting physician, patient and/or family and they are comfortable with the plan.   Final Clinical Impressions(s) / ED Diagnoses   Final diagnoses:  Congestive heart failure with left ventricular systolic dysfunction ([place])  ED Discharge Orders    None       Deno Etienne, DO 12/31/17 0800

## 2017-12-31 NOTE — H&P (Signed)
History and Physical    Terrence Garcia ZOX:[id number] DOB: 09/24/1955 DOA: 12/31/2017  PCP: Default, Provider, MD   Patient coming from: Home  Chief Complaint: SOB  HPI: Terrence Garcia is a 62 y.o. male with medical history significant of  Chronic back pain, hypertension, hyperlipidemia history of stroke, history of BPH and prostate enlargement, history of substance abuse including crack cocaine, and history of tobacco abuse who presented to the emergency room with a chief complaint of shortness of breath.  Patient states that he has been short of breath over a few weeks now.  And states that last week he did cocaine and started feeling more more short of breath after that.  Patient states that he had significant leg swelling as well as swelling in his abdomen and was unable to lie flat.  And states he is very short of breath with exertion.  He denies any cough or chest pain.  However he states that his shortness of breath is his main complaint.  He is transferred over from [facility] for acute CHF exacerbation [facility] was called to admit.  ED Course: Patient was given nitroglycerin ointment 1 inch topically, and given IV furosemide as well as nitroglycerin sublingual tablet.  Patient was started back on his home high blood pressure medications.  Review of Systems: As per HPI otherwise 10 point review of systems negative.   Past Medical History:  Diagnosis Date  . Chronic back pain   . Gastric ulcer   . Hypertension   . Prostate enlargement   . Stroke [facility])    Past Surgical History:  Procedure Laterality Date  . BACK SURGERY     SOCIAL  reports that he has been smoking.  He has never used smokeless tobacco. He reports that he has current or past drug history. He reports that he does not drink alcohol.  Allergies  Allergen Reactions  . Iodinated Diagnostic Agents Nausea And Vomiting  . Penicillins Other (See Comments)    Has patient had a PCN reaction causing immediate rash,  facial/tongue/throat swelling, SOB or lightheadedness with hypotension: no Has patient had a PCN reaction causing severe rash involving mucus membranes or skin necrosis: no Has patient had a PCN reaction that required hospitalization: no Has patient had a PCN reaction occurring within the last 10 years: No If all of the above answers are "NO", then may proceed with Cephalosporin use. "pt states does nothing for him"   FAMILY HISTORY Reviewed with the patient and noncontributory to the presenting complaint.  Prior to Admission medications   Medication Sig Start Date End Date Taking? Authorizing Provider  acetaZOLAMIDE (DIAMOX) 500 MG capsule Take 500 mg by mouth 2 (two) times daily.    [provider]  amLODipine (NORVASC) 5 MG tablet Take 5 mg by mouth daily.    [provider]  atorvastatin (LIPITOR) 20 MG tablet Take 20 mg by mouth daily.    [provider]  azithromycin (ZITHROMAX) 250 MG tablet Take 1 tablet (250 mg total) by mouth daily. Take first 2 tablets together, then 1 every day until finished. 01/25/16   Leo Grosser, MD  Butalbital-APAP-Caffeine (FIORICET) 50-300-40 MG CAPS Take by mouth.    [provider]  fluticasone (FLONASE) 50 MCG/ACT nasal spray Place into both nostrils daily.    [provider]  hydrOXYzine (ATARAX/VISTARIL) 25 MG tablet Take by mouth 3 (three) times daily as needed.    [provider]  nortriptyline (PAMELOR) 10 MG capsule Take 10  mg by mouth at bedtime.    [provider]  tamsulosin (FLOMAX) 0.4 MG CAPS capsule Take 0.4 mg by mouth.    [provider]  triamterene-hydrochlorothiazide (DYAZIDE) 37.5-25 MG capsule Take 1 capsule by mouth daily.    [provider]   Physical Exam: Vitals:   12/31/17 0900 12/31/17 1045 12/31/17 1400 12/31/17 1411  BP: (!) 131/101 (!) 123/101  126/82  Pulse: 78 87    Resp: (!) 24 20  18   Temp:  98.4 F (36.9 C)  98.3 F (36.8 C)    TempSrc:  Oral  Oral  SpO2: 90% 98%  100%  Weight:   78.3 kg (172 lb 9.9 oz)   Height:       Constitutional: WN/WD AAM in NAD and appears calm but uncomfortable Eyes: Lids and conjunctivae normal, sclerae anicteric  ENMT: External Ears, Nose appear normal. Grossly normal hearing. Mucous membranes are moist.  Neck: Appears normal, supple, no cervical masses, normal ROM, no appreciable thyromegaly; no JVD Respiratory: Diminished to auscultation bilaterally with some crackles. No wheezing, rales, rhonchi. Normal respiratory effort and patient is not tachypenic. No accessory muscle use.  Cardiovascular: RRR, no murmurs / rubs / gallops. S1 and S2 auscultated. 1+ LE edema Bilaterally  Abdomen: Soft, non-tender, non-distended. No masses palpated. No appreciable hepatosplenomegaly. Bowel sounds positive x4.  GU: Deferred. Musculoskeletal: No clubbing / cyanosis of digits/nails. No joint deformity upper and lower extremities Skin: No rashes, lesions, ulcers on a limited skin eval. No induration; Warm and dry.  Neurologic: CN 2-12 grossly intact with no focal deficits. Romberg sign and cerebellar reflexes not assessed.  Psychiatric: Normal judgment and insight. Alert and oriented x 3. Appears Somnolent but Normal mood and appropriate affect.   Labs on Admission: I have personally reviewed following labs and imaging studies  CBC: Recent Labs  Lab 12/31/17 0644 12/31/17 1121  WBC 5.1 5.9  NEUTROABS 3.4  --   HGB 13.3 13.7  HCT 38.1* 41.3  MCV 78.9 81.5  PLT 249 809   Basic Metabolic Panel: Recent Labs  Lab 12/31/17 0644 12/31/17 1121 12/31/17 1654  NA 140  --  144  K 3.8  --  3.7  CL 111  --  111  CO2 19*  --  24  GLUCOSE 111*  --  101*  BUN 34*  --  31*  CREATININE 1.40* 1.56* 1.69*  CALCIUM 8.5*  --  8.6*  MG  --   --  2.1  PHOS  --   --  3.9   GFR: Estimated Creatinine Clearance: 45.5 mL/min (A) (by C-G formula based on SCr of 1.69 mg/dL (H)). Liver Function  Tests: Recent Labs  Lab 12/31/17 1654  AST 44*  ALT 71*  ALKPHOS 65  BILITOT 0.4  PROT 6.2*  ALBUMIN 2.9*   No results for input(s): LIPASE, AMYLASE in the last 168 hours. No results for input(s): AMMONIA in the last 168 hours. Coagulation Profile: No results for input(s): INR, PROTIME in the last 168 hours. Cardiac Enzymes: Recent Labs  Lab 12/31/17 0644 12/31/17 1121 12/31/17 1654  TROPONINI 0.03* 0.03* 0.03*   BNP (last 3 results) No results for input(s): PROBNP in the last 8760 hours. HbA1C: No results for input(s): HGBA1C in the last 72 hours. CBG: No results for input(s): GLUCAP in the last 168 hours. Lipid Profile: No results for input(s): CHOL, HDL, LDLCALC, TRIG, CHOLHDL, LDLDIRECT in the last 72 hours. Thyroid Function Tests: Recent Labs    12/31/17 1121  TSH 1.178   Anemia Panel: No results for input(s): VITAMINB12, FOLATE, FERRITIN, TIBC, IRON, RETICCTPCT in the last 72 hours. Urine analysis: No results found for: COLORURINE, APPEARANCEUR, LABSPEC, PHURINE, GLUCOSEU, HGBUR, BILIRUBINUR, KETONESUR, PROTEINUR, UROBILINOGEN, NITRITE, LEUKOCYTESUR Sepsis Labs: !!!!!!!!!!!!!!!!!!!!!!!!!!!!!!!!!!!!!!!!!!!! @LABRCNTIP (procalcitonin:4,lacticidven:4) )No results found for this or any previous visit (from the past 240 hour(s)).   Radiological Exams on Admission: Dg Chest Port 1 View  Result Date: 12/31/2017 CLINICAL DATA:  Shortness of breath EXAM: PORTABLE CHEST 1 VIEW COMPARISON:  04/19/2017 FINDINGS: Cardiac enlargement. Pulmonary vascular congestion. Perihilar and basal infiltrates consistent with edema. No focal consolidation or volume loss. Small amount of fluid in the fissure. No blunting of costophrenic angles. No pneumothorax. Mediastinal contours appear intact. IMPRESSION: Cardiac enlargement with pulmonary vascular congestion and bilateral edema. Mild fluid in the fissures. Electronically Signed   By: Lucienne Capers M.D.   On: 12/31/2017 06:41   EKG:  Independently reviewed.  Showed a sinus rhythm with a rate of 96 with some ST depressions in V5 and V6.  Patient also had some PVCs.  No evidence of any ST elevation on my interpretation.  Assessment/Plan Active Problems:   Acute CHF (congestive heart failure) (HCC)   CHF (congestive heart failure) (HCC)   BPH (benign prostatic hyperplasia)   Abnormal LFTs   Tobacco abuse   Cocaine abuse (HCC)   Renal insufficiency   HLD (hyperlipidemia)   History of CVA (cerebrovascular accident)  Acute CHF exacerbation suspect systolic given his recent nuclear stress test with an EF of 32% -Admit as an inpatient to telemetry -Likely exacerbated and worsened in the setting of cocaine use -Chest x-ray showed Cardiac enlargement with pulmonary vascular congestion and bilateral edema. Mild fluid in the fissures. -Patient's BNP on admission is 928.0 -Start IV diuresis with Lasix 40 mg twice daily.  Patient received 40 mg IV Lasix in the a.m. -Strict I's and O's, daily weights, saline lock IV, and fluid restrict to 1500 cc. -Patient is -3.31 L so far -Check echocardiogram -Avoid beta-blockers secondary to acute decompensation as well as cocaine use.  Avoid ACE inhibitors/ARB secondary to renal insufficiency -Cycle Cardiac Troponins to rule out new ischemia.  Troponins were 0.03 x 3 -Repeat Chest x-ray in the a.m. -Based on Echocardiographic findings may possibly need to Consult cardiology  Essential Hypertension -Hold home diuretics given renal function and insufficiency -Continue with home amlodipine 5 mg p.o. daily  Hyperlipidemia -Continue with atorvastatin 20 mg p.o. daily  BPH -Continue Tamsulosin 0.4 mg p.o. after supper  Renal Insufficiency with suspected CKD -Try to avoid nephrotoxic medications -Hold home diuretics -Continue with IV diuresis as above -BUN and creatinine was 34/1.40 and worsened to 31/1.69 -Continue to monitor renal function very carefully and repeat CMP in the  a.m.  Abnormal LFTs -Patient's AST on admission was 44 and ALT was 71 -Obtain right upper quadrant ultrasound as well as acute hepatitis panel -Repeat CMP in a.m.  Polysubstance Abuse and Cocaine Abuse -UDS positive was positive for cocaine -Counseling given  Tobacco Abuse -Smoking cessation counseling given  Hx of CVA -C/w Atorvastatin 20 mg po Daily  DVT prophylaxis: Heparin 5,000 units sq given renal Insuffiencey Code Status: FULL CODE Family Communication: No family present at bedside Disposition Plan: Anticipate D/C Home in 24-48 hours Consults called: None Admission status: Inpatient Telemetry  Severity of Illness: The appropriate patient status for this patient is INPATIENT. Inpatient status is judged to be reasonable and necessary in order to provide the required intensity of service to ensure  the patient's safety. The patient's presenting symptoms, physical exam findings, and initial radiographic and laboratory data in the context of their chronic comorbidities is felt to place them at high risk for further clinical deterioration. Furthermore, it is not anticipated that the patient will be medically stable for discharge from the hospital within 2 midnights of admission. The following factors support the patient status of inpatient.   " The patient's presenting symptoms include Dyspnea. " The worrisome physical exam findings include Crackles and leg swelling. " The initial radiographic and laboratory data are worrisome because of findings consistent with CHF. " The chronic co-morbidities include HTN, HLD, Hx of CVA, BPH.  * I certify that at the point of admission it is my clinical judgment that the patient will require inpatient hospital care spanning beyond 2 midnights from the point of admission due to high intensity of service, high risk for further deterioration and high frequency of surveillance required.Kerney Elbe, D.O. [facility] Pager  [phone]  If 7PM-7AM, please contact night-coverage [email or web address] Password [facility]  12/31/2017, 7:11 PM

## 2017-12-31 NOTE — ED Notes (Signed)
Report called to Kim, RN.

## 2018-01-01 ENCOUNTER — Inpatient Hospital Stay (HOSPITAL_COMMUNITY): Payer: Medicare Other

## 2018-01-01 DIAGNOSIS — I5021 Acute systolic (congestive) heart failure: Secondary | ICD-10-CM

## 2018-01-01 DIAGNOSIS — I313 Pericardial effusion (noninflammatory): Secondary | ICD-10-CM

## 2018-01-01 LAB — COMPREHENSIVE METABOLIC PANEL
ALT: 64 U/L — AB (ref 17–63)
AST: 33 U/L (ref 15–41)
Albumin: 2.8 g/dL — ABNORMAL LOW (ref 3.5–5.0)
Alkaline Phosphatase: 62 U/L (ref 38–126)
Anion gap: 9 (ref 5–15)
BUN: 26 mg/dL — AB (ref 6–20)
CHLORIDE: 112 mmol/L — AB (ref 101–111)
CO2: 21 mmol/L — AB (ref 22–32)
CREATININE: 1.61 mg/dL — AB (ref 0.61–1.24)
Calcium: 8.5 mg/dL — ABNORMAL LOW (ref 8.9–10.3)
GFR calc Af Amer: 52 mL/min — ABNORMAL LOW (ref >60)
GFR, EST NON AFRICAN AMERICAN: 45 mL/min — AB (ref >60)
Glucose, Bld: 97 mg/dL (ref 65–99)
Potassium: 3.5 mmol/L (ref 3.5–5.1)
Sodium: 142 mmol/L (ref 135–145)
Total Bilirubin: 0.5 mg/dL (ref 0.3–1.2)
Total Protein: 6.3 g/dL — ABNORMAL LOW (ref 6.5–8.1)

## 2018-01-01 LAB — CBC WITH DIFFERENTIAL/PLATELET
BASOS ABS: 0 10*3/uL (ref 0.0–0.1)
Basophils Relative: 0 %
EOS PCT: 2 %
Eosinophils Absolute: 0.1 10*3/uL (ref 0.0–0.7)
HCT: 43.6 % (ref 39.0–52.0)
Hemoglobin: 14.1 g/dL (ref 13.0–17.0)
LYMPHS PCT: 25 %
Lymphs Abs: 1.5 10*3/uL (ref 0.7–4.0)
MCH: 26.4 pg (ref 26.0–34.0)
MCHC: 32.3 g/dL (ref 30.0–36.0)
MCV: 81.6 fL (ref 78.0–100.0)
Monocytes Absolute: 0.4 10*3/uL (ref 0.1–1.0)
Monocytes Relative: 6 %
NEUTROS ABS: 4.1 10*3/uL (ref 1.7–7.7)
Neutrophils Relative %: 67 %
PLATELETS: 259 10*3/uL (ref 150–400)
RBC: 5.34 MIL/uL (ref 4.22–5.81)
RDW: 14.2 % (ref 11.5–15.5)
WBC: 6.2 10*3/uL (ref 4.0–10.5)

## 2018-01-01 LAB — PHOSPHORUS: Phosphorus: 4.3 mg/dL (ref 2.5–4.6)

## 2018-01-01 LAB — ECHOCARDIOGRAM COMPLETE
Height: 68 in
Weight: 2673.74 oz

## 2018-01-01 LAB — HIV ANTIBODY (ROUTINE TESTING W REFLEX): HIV Screen 4th Generation wRfx: NONREACTIVE

## 2018-01-01 LAB — MAGNESIUM: MAGNESIUM: 2 mg/dL (ref 1.7–2.4)

## 2018-01-01 MED ORDER — HYDRALAZINE HCL 20 MG/ML IJ SOLN: 10.0000 mg | Freq: Four times a day (QID) | INTRAMUSCULAR | Status: DC | PRN

## 2018-01-01 MED ORDER — IPRATROPIUM-ALBUTEROL 0.5-2.5 (3) MG/3ML IN SOLN
3.0000 mL | Freq: Four times a day (QID) | RESPIRATORY_TRACT | Status: DC
  Administered 2018-01-01 (×2): 3 mL via RESPIRATORY_TRACT
  Filled 2018-01-01 (×2): qty 3

## 2018-01-01 MED ORDER — ASPIRIN 81 MG PO CHEW: 81.0000 mg | CHEWABLE_TABLET | Freq: Every day | ORAL | Status: DC

## 2018-01-01 MED ORDER — IPRATROPIUM-ALBUTEROL 0.5-2.5 (3) MG/3ML IN SOLN
3.0000 mL | Freq: Three times a day (TID) | RESPIRATORY_TRACT | Status: DC
  Administered 2018-01-02: 3 mL via RESPIRATORY_TRACT
  Filled 2018-01-01: qty 3

## 2018-01-01 MED ORDER — ALBUTEROL SULFATE (2.5 MG/3ML) 0.083% IN NEBU: 2.5000 mg | INHALATION_SOLUTION | RESPIRATORY_TRACT | Status: DC | PRN

## 2018-01-01 MED ORDER — ASPIRIN EC 81 MG PO TBEC
81.0000 mg | DELAYED_RELEASE_TABLET | Freq: Every day | ORAL | Status: DC
  Administered 2018-01-01 – 2018-01-03 (×3): 81 mg via ORAL
  Filled 2018-01-01 (×3): qty 1

## 2018-01-01 NOTE — Progress Notes (Signed)
  Echocardiogram 2D Echocardiogram has been performed.  Terrence Garcia 01/01/2018, 11:25 AM

## 2018-01-01 NOTE — Progress Notes (Addendum)
PROGRESS NOTE    Terrence Garcia  CHY:[id number] DOB: Aug 06, 1956 DOA: 12/31/2017 PCP: Default, Provider, MD   Brief Narrative:  Terrence Garcia is a 61 y.o. male with medical history significant of  Chronic back pain, hypertension, hyperlipidemia history of stroke, history of BPH and prostate enlargement, history of substance abuse including crack cocaine, and history of tobacco abuse who presented to the emergency room with a chief complaint of shortness of breath.  Patient states that he has been short of breath over a few weeks now.  And states that last week he did cocaine and started feeling more more short of breath after that.  Patient states that he had significant leg swelling as well as swelling in his abdomen and was unable to lie flat.  And states he is very short of breath with exertion.  He denies any cough or chest pain.  However he states that his shortness of breath is his main complaint.  He is transferred over from [facility] for acute CHF exacerbation [facility] was called to admit. ECHO showed severe Low EF so Cardiology was consulted for further evaluation and recommendations.   Assessment & Plan:   Active Problems:   Acute CHF (congestive heart failure) (HCC)   CHF (congestive heart failure) (HCC)   BPH (benign prostatic hyperplasia)   Abnormal LFTs   Tobacco abuse   Cocaine abuse (HCC)   Renal insufficiency   HLD (hyperlipidemia)   History of CVA (cerebrovascular accident)  Acute Decompensation of Systolic CHF exacerbation with EF 15% -Admit as an inpatient to telemetry -Likely exacerbated and worsened in the setting of cocaine use -Chest x-ray showed Cardiac enlargement with pulmonary vascular congestion and bilateral edema. Mild fluid in the fissures. -Patient's BNP on admission is 928.0 -Recent Nuclear Stress Test with an EF of 32% on Stress Test in [place] -Start IV diuresis with Lasix 40 mg twice daily.  Patient received 40 mg IV Lasix in the a.m. -Strict I's and O's,  daily weights, saline lock IV, and fluid restrict to 1500 cc. -Patient is -4.650 L so far -Checked Echocardiogram and showed Severe global reduction in LV systolic function; elevated LV filling pressure; mild LVH and LVE; trace AI; mildly dilated aortic root; mild MR; moderate LAE; small pericardial effusion with mild RA collapse. -Avoid beta-blockers secondary to acute decompensation as well as cocaine use.  Avoid ACE inhibitors/ARB secondary to renal insufficiency -Cycle Cardiac Troponins to rule out new ischemia.  Troponins were 0.03 x 3 -Repeat CXR this AM showed Notable improvement in pulmonary edema compared to prior exam. Bibasilar atelectasis. Mild enlargement of the cardiopericardial silhouette -Consulted Cardiology Dr. Meda Coffee for further evaluation and recommendations   Essential Hypertension -Patient's Blood Pressure was 146/102 this AM -Hold Home Diuretics given renal function and insufficiency -Continue with Home Amlodipine 5 mg p.o. Daily -Add Hydralazine 10 mg IV q6hprn for SBP >160 or DBP >100  Hyperlipidemia -Check Lipid Panel in AM  -Continue with Atorvastatin 20 mg p.o. daily  BPH -Continue Tamsulosin 0.4 mg p.o. after supper  Renal Insufficiency with suspected CKD?; Unclear Baseline but suspect CKD Stage 3 -Try to avoid nephrotoxic medications -Hold home diuretics -Continue with IV diuresis as above; C/w IV Lasix 40 mg BID  -BUN and creatinine was 34/1.40  -> 31/1.69 -> 26/1.26 -Continue to monitor renal function very carefully and repeat CMP in the a.m.  Abnormal LFTs -Patient's AST on admission was 44 and ALT was 71 -Obtained Right Upper Quadrant ultrasound and showed Two echogenic liver masses  are noted. On the prior CT, there were lesions in the posterior segment the right lobe, measuring up to 6.6 cm in overall greatest dimension. There was also a lesion in the posterior aspect of the lateral segment of the left lobe measuring 4.8 cm. The 3.5 cm  echogenic lesion in the right lobe on the current ultrasound corresponds to the larger abnormality seen on the prior CT. The previously seen left lobe lateral segment lesion is not visualized sonographically, likely obscured by bowel gas. There is a 1.9 cm echogenic lesion in the central liver, not definitively seen on the prior CT. These lesions could be further assessed and characterized with liver MRI with and without contrast. There are likely hemangiomas. No acute findings.  Normal gallbladder.  No bile duct dilation. -Can Have Liver Lesions worked up with MRI w/wo Contrast as an outpatient  -Acute Hepatitis Panel pending  -Repeat CMP in a.m.  Polysubstance Abuse and Cocaine Abuse -UDS positive was positive for cocaine -Counseling given  Tobacco Abuse -Smoking cessation counseling given  Hx of CVA -Will Add ASA 81 mg po Daily -C/w Atorvastatin 20 mg po Daily  Pulsatile Tinnitus with Hx of Headaches -C/w Acetazolamide 500 mg po BID  -Has taken Fioricet in the Past for Headaches  DVT prophylaxis: Heparin 5,000 units sq q8h Code Status: FULL CODE Family Communication: No family present at bedside  Disposition Plan: Remain Inpatient for Diuresis and Cardiology Evaluation  Consultants:   Cardiology Dr. Meda Coffee   Procedures:  ECHOCARDIOGRAM Study Conclusions  - Left ventricle: The cavity size was mildly dilated. Wall   thickness was increased in a pattern of mild LVH. Systolic   function was severely reduced. The estimated ejection fraction   was 15%. Diffuse hypokinesis. Doppler parameters are consistent   with high ventricular filling pressure. - Aortic valve: There was trivial regurgitation. - Aortic root: The aortic root was mildly dilated. - Mitral valve: There was mild regurgitation. - Left atrium: The atrium was moderately dilated. - Pericardium, extracardiac: A small pericardial effusion was   identified.  Impressions:  - Severe global reduction in  LV systolic function; elevated LV   filling pressure; mild LVH and LVE; trace AI; mildly dilated   aortic root; mild MR; moderate LAE; small pericardial effusion   with mild RA collapse.  Antimicrobials:  Anti-infectives (From admission, onward)   None     Subjective: Seen and examined at bedside and continued to be dyspneic.  No chest pain, lightheadedness or dizziness but did have some abdominal pain and felt the urge to urinate quite frequently.  No other complaints or concerns at this time.  Objective: Vitals:   01/01/18 0500 01/01/18 0907 01/01/18 1323 01/01/18 1334  BP:  (!) 146/102    Pulse:  86    Resp:  20    Temp:      TempSrc:      SpO2:  100% 94% 90%  Weight: 75.8 kg (167 lb 1.7 oz)     Height:        Intake/Output Summary (Last 24 hours) at 01/01/2018 1342 Last data filed at 01/01/2018 1258 Gross per 24 hour  Intake 360 ml  Output 2550 ml  Net -2190 ml   Filed Weights   12/31/17 0615 12/31/17 1400 01/01/18 0500  Weight: 86.5 kg (190 lb 12.8 oz) 78.3 kg (172 lb 9.9 oz) 75.8 kg (167 lb 1.7 oz)   Examination: Physical Exam:  Constitutional: WN/WD AAM NAD and appears calm and comfortable getting ECHO done  Eyes: Lids and conjunctivae normal, sclerae anicteric  ENMT: External Ears, Nose appear normal. Grossly normal hearing. Mucous membranes are moist. .  Neck: Appears normal, supple, no cervical masses, normal ROM, no appreciable thyromegaly, mild JVD Respiratory: Diminished to auscultation bilaterally, no wheezing, rales, rhonchi or crackles. Normal respiratory effort and patient is not tachypenic. No accessory muscle use.  Cardiovascular: RRR, no murmurs / rubs / gallops. S1 and S2 auscultated. Trace extremity edema.  Abdomen: Soft,Slightly tender, non-distended. No masses palpated. No appreciable hepatosplenomegaly. Bowel sounds positive x4.  GU: Deferred. Musculoskeletal: No clubbing / cyanosis of digits/nails. No joint deformity upper and lower extremities.    Skin: No rashes, lesions, ulcers on a limited skin evaluation. No induration; Warm and dry.  Neurologic: CN 2-12 grossly intact with no focal deficits. Romberg sign and cerebellar reflexes not assessed.  Psychiatric: Normal judgment and insight. Alert and oriented x 3. Normal mood and appropriate affect.   Data Reviewed: I have personally reviewed following labs and imaging studies  CBC: Recent Labs  Lab 12/31/17 0644 12/31/17 1121 01/01/18 0437  WBC 5.1 5.9 6.2  NEUTROABS 3.4  --  4.1  HGB 13.3 13.7 14.1  HCT 38.1* 41.3 43.6  MCV 78.9 81.5 81.6  PLT 249 256 401   Basic Metabolic Panel: Recent Labs  Lab 12/31/17 0644 12/31/17 1121 12/31/17 1654 01/01/18 0437  NA 140  --  144 142  K 3.8  --  3.7 3.5  CL 111  --  111 112*  CO2 19*  --  24 21*  GLUCOSE 111*  --  101* 97  BUN 34*  --  31* 26*  CREATININE 1.40* 1.56* 1.69* 1.61*  CALCIUM 8.5*  --  8.6* 8.5*  MG  --   --  2.1 2.0  PHOS  --   --  3.9 4.3   GFR: Estimated Creatinine Clearance: 47.8 mL/min (A) (by C-G formula based on SCr of 1.61 mg/dL (H)). Liver Function Tests: Recent Labs  Lab 12/31/17 1654 01/01/18 0437  AST 44* 33  ALT 71* 64*  ALKPHOS 65 62  BILITOT 0.4 0.5  PROT 6.2* 6.3*  ALBUMIN 2.9* 2.8*   No results for input(s): LIPASE, AMYLASE in the last 168 hours. No results for input(s): AMMONIA in the last 168 hours. Coagulation Profile: No results for input(s): INR, PROTIME in the last 168 hours. Cardiac Enzymes: Recent Labs  Lab 12/31/17 0644 12/31/17 1121 12/31/17 1654 12/31/17 2232  TROPONINI 0.03* 0.03* 0.03* 0.03*   BNP (last 3 results) No results for input(s): PROBNP in the last 8760 hours. HbA1C: No results for input(s): HGBA1C in the last 72 hours. CBG: No results for input(s): GLUCAP in the last 168 hours. Lipid Profile: No results for input(s): CHOL, HDL, LDLCALC, TRIG, CHOLHDL, LDLDIRECT in the last 72 hours. Thyroid Function Tests: Recent Labs    12/31/17 1121  TSH  1.178   Anemia Panel: No results for input(s): VITAMINB12, FOLATE, FERRITIN, TIBC, IRON, RETICCTPCT in the last 72 hours. Sepsis Labs: No results for input(s): PROCALCITON, LATICACIDVEN in the last 168 hours.  No results found for this or any previous visit (from the past 240 hour(s)).   Radiology Studies: Dg Chest 2 View  Result Date: 01/01/2018 CLINICAL DATA:  Shortness of breath EXAM: CHEST - 2 VIEW COMPARISON:  12/31/2017 FINDINGS: The interstitial edema and vascular indistinctness shown on the prior exam is considerably improved. Bandlike subsegmental atelectasis in the lingula. A thick band of atelectasis is present in the right middle lobe  laterally. Small bilateral pleural effusions blunt the posterior costophrenic angles. Mild enlargement of the cardiopericardial silhouette is present. IMPRESSION: 1. Notable improvement in pulmonary edema compared to prior exam. 2. Bibasilar atelectasis. 3. Mild enlargement of the cardiopericardial silhouette. Electronically Signed   By: Van Clines M.D.   On: 01/01/2018 08:31   Dg Chest Port 1 View  Result Date: 12/31/2017 CLINICAL DATA:  Shortness of breath EXAM: PORTABLE CHEST 1 VIEW COMPARISON:  04/19/2017 FINDINGS: Cardiac enlargement. Pulmonary vascular congestion. Perihilar and basal infiltrates consistent with edema. No focal consolidation or volume loss. Small amount of fluid in the fissure. No blunting of costophrenic angles. No pneumothorax. Mediastinal contours appear intact. IMPRESSION: Cardiac enlargement with pulmonary vascular congestion and bilateral edema. Mild fluid in the fissures. Electronically Signed   By: Lucienne Capers M.D.   On: 12/31/2017 06:41   US Abdomen Limited Ruq  Result Date: 01/01/2018 CLINICAL DATA:  Abnormal liver function tests. EXAM: ULTRASOUND ABDOMEN LIMITED RIGHT UPPER QUADRANT COMPARISON:  CT, 04/27/2010. FINDINGS: Gallbladder: No gallstones or wall thickening visualized. No sonographic Murphy sign  noted by sonographer. Common bile duct: Diameter: 4 mm Liver: Mildly heterogeneous echogenicity. There is an echogenic mass in the inferior right lobe measuring 3.4 x 2.5 x 3.5 cm. Another mostly echogenic mass lies in the central liver measuring 1.8 x 1.3 x 1.9 cm. No other discrete liver masses. The mass noted along the posterior left lobe on the prior CT is not visualized. Portal vein is patent on color Doppler imaging with normal direction of blood flow towards the liver. Incidental note is made of a small right pleural effusion. IMPRESSION: 1. Two echogenic liver masses are noted. On the prior CT, there were lesions in the posterior segment the right lobe, measuring up to 6.6 cm in overall greatest dimension. There was also a lesion in the posterior aspect of the lateral segment of the left lobe measuring 4.8 cm. The 3.5 cm echogenic lesion in the right lobe on the current ultrasound corresponds to the larger abnormality seen on the prior CT. The previously seen left lobe lateral segment lesion is not visualized sonographically, likely obscured by bowel gas. There is a 1.9 cm echogenic lesion in the central liver, not definitively seen on the prior CT. These lesions could be further assessed and characterized with liver MRI with and without contrast. There are likely hemangiomas. 2. No acute findings.  Normal gallbladder.  No bile duct dilation. Electronically Signed   By: Lajean Manes M.D.   On: 01/01/2018 11:53   Scheduled Meds: . acetaZOLAMIDE  500 mg Oral BID  . amLODipine  5 mg Oral Daily  . atorvastatin  20 mg Oral q1800  . fluticasone  1 spray Each Nare Daily  . furosemide  40 mg Intravenous BID  . heparin  5,000 Units Subcutaneous Q8H  . ipratropium-albuterol  3 mL Nebulization Q6H  . nortriptyline  10 mg Oral QHS  . sodium chloride flush  3 mL Intravenous Q12H  . tamsulosin  0.4 mg Oral QPC supper   Continuous Infusions: . sodium chloride      LOS: 1 day   Kerney Elbe,  DO [facility] Pager [phone]  If 7PM-7AM, please contact night-coverage [email or web address] Password [email or web address] 01/01/2018, 1:42 PM

## 2018-01-01 NOTE — Progress Notes (Signed)
OT Cancellation Note  Patient Details Name: Terrence Garcia MRN: [id number] DOB: [birth year]   Cancelled Treatment:    Reason Eval/Treat Not Completed: OT screened, no needs identified, will sign off    [place], Mickel Baas, [place] 01/01/2018, 1:46 PM

## 2018-01-01 NOTE — Progress Notes (Signed)
PT Cancellation Note  Patient Details Name: Terrence Garcia MRN: [id number] DOB: April 25, 1956   Cancelled Treatment:    Reason Eval/Treat Not Completed: PT screened, no needs identified, will sign off Pt reports ambulating with staff and denies any needs at this time.  PT to sign off.    Lateisha Thurlow,KATHrine E 01/01/2018, 10:48 AM Carmelia Bake, PT, DPT 01/01/2018 Pager: [phone]

## 2018-01-02 ENCOUNTER — Inpatient Hospital Stay (HOSPITAL_COMMUNITY): Payer: Medicare Other

## 2018-01-02 DIAGNOSIS — N184 Chronic kidney disease, stage 4 (severe): Secondary | ICD-10-CM

## 2018-01-02 DIAGNOSIS — R0602 Shortness of breath: Secondary | ICD-10-CM

## 2018-01-02 DIAGNOSIS — I5041 Acute combined systolic (congestive) and diastolic (congestive) heart failure: Secondary | ICD-10-CM

## 2018-01-02 DIAGNOSIS — I5020 Unspecified systolic (congestive) heart failure: Secondary | ICD-10-CM

## 2018-01-02 DIAGNOSIS — N170 Acute kidney failure with tubular necrosis: Secondary | ICD-10-CM

## 2018-01-02 LAB — COMPREHENSIVE METABOLIC PANEL
ALBUMIN: 3 g/dL — AB (ref 3.5–5.0)
ALT: 53 U/L (ref 17–63)
ANION GAP: 12 (ref 5–15)
AST: 22 U/L (ref 15–41)
Alkaline Phosphatase: 61 U/L (ref 38–126)
BILIRUBIN TOTAL: 0.5 mg/dL (ref 0.3–1.2)
BUN: 33 mg/dL — ABNORMAL HIGH (ref 6–20)
CO2: 19 mmol/L — ABNORMAL LOW (ref 22–32)
Calcium: 8.7 mg/dL — ABNORMAL LOW (ref 8.9–10.3)
Chloride: 110 mmol/L (ref 101–111)
Creatinine, Ser: 1.88 mg/dL — ABNORMAL HIGH (ref 0.61–1.24)
GFR calc Af Amer: 43 mL/min — ABNORMAL LOW (ref >60)
GFR, EST NON AFRICAN AMERICAN: 37 mL/min — AB (ref >60)
Glucose, Bld: 104 mg/dL — ABNORMAL HIGH (ref 65–99)
Potassium: 3.5 mmol/L (ref 3.5–5.1)
Sodium: 141 mmol/L (ref 135–145)
TOTAL PROTEIN: 6.6 g/dL (ref 6.5–8.1)

## 2018-01-02 LAB — CBC WITH DIFFERENTIAL/PLATELET
BASOS PCT: 0 %
Basophils Absolute: 0 10*3/uL (ref 0.0–0.1)
EOS PCT: 2 %
Eosinophils Absolute: 0.1 10*3/uL (ref 0.0–0.7)
HEMATOCRIT: 46 % (ref 39.0–52.0)
Hemoglobin: 15.1 g/dL (ref 13.0–17.0)
Lymphocytes Relative: 25 %
Lymphs Abs: 1.5 10*3/uL (ref 0.7–4.0)
MCH: 27 pg (ref 26.0–34.0)
MCHC: 32.8 g/dL (ref 30.0–36.0)
MCV: 82.1 fL (ref 78.0–100.0)
MONO ABS: 0.5 10*3/uL (ref 0.1–1.0)
MONOS PCT: 9 %
Neutro Abs: 3.8 10*3/uL (ref 1.7–7.7)
Neutrophils Relative %: 64 %
Platelets: 290 10*3/uL (ref 150–400)
RBC: 5.6 MIL/uL (ref 4.22–5.81)
RDW: 14.1 % (ref 11.5–15.5)
WBC: 5.9 10*3/uL (ref 4.0–10.5)

## 2018-01-02 LAB — MAGNESIUM: MAGNESIUM: 2.2 mg/dL (ref 1.7–2.4)

## 2018-01-02 LAB — LIPID PANEL
CHOL/HDL RATIO: 3.2 ratio
CHOLESTEROL: 180 mg/dL (ref 0–200)
HDL: 57 mg/dL (ref >40)
LDL Cholesterol: 113 mg/dL — ABNORMAL HIGH (ref 0–99)
TRIGLYCERIDES: 48 mg/dL (ref <150)
VLDL: 10 mg/dL (ref 0–40)

## 2018-01-02 LAB — HEPATITIS PANEL, ACUTE
Hep A IgM: NEGATIVE
Hep B C IgM: NEGATIVE
Hepatitis B Surface Ag: NEGATIVE

## 2018-01-02 LAB — PHOSPHORUS: Phosphorus: 4.5 mg/dL (ref 2.5–4.6)

## 2018-01-02 LAB — TROPONIN I
Troponin I: <0.03 ng/mL (ref <0.03)
Troponin I: <0.03 ng/mL (ref <0.03)

## 2018-01-02 MED ORDER — IPRATROPIUM-ALBUTEROL 0.5-2.5 (3) MG/3ML IN SOLN
3.0000 mL | Freq: Two times a day (BID) | RESPIRATORY_TRACT | Status: DC
  Administered 2018-01-02: 3 mL via RESPIRATORY_TRACT
  Filled 2018-01-02: qty 3

## 2018-01-02 MED ORDER — PANTOPRAZOLE SODIUM 40 MG PO TBEC
40.0000 mg | DELAYED_RELEASE_TABLET | Freq: Every day | ORAL | Status: DC
  Administered 2018-01-02 – 2018-01-03 (×2): 40 mg via ORAL
  Filled 2018-01-02 (×2): qty 1

## 2018-01-02 MED ORDER — ISOSORBIDE MONONITRATE ER 30 MG PO TB24
30.0000 mg | ORAL_TABLET | Freq: Every day | ORAL | Status: DC
  Administered 2018-01-02 – 2018-01-03 (×2): 30 mg via ORAL
  Filled 2018-01-02 (×2): qty 1

## 2018-01-02 MED ORDER — POTASSIUM CHLORIDE CRYS ER 20 MEQ PO TBCR
40.0000 meq | EXTENDED_RELEASE_TABLET | Freq: Once | ORAL | Status: AC
  Administered 2018-01-02: 40 meq via ORAL
  Filled 2018-01-02: qty 2

## 2018-01-02 MED ORDER — HYDRALAZINE HCL 25 MG PO TABS
25.0000 mg | ORAL_TABLET | Freq: Three times a day (TID) | ORAL | Status: DC
  Administered 2018-01-02 – 2018-01-03 (×4): 25 mg via ORAL
  Filled 2018-01-02 (×4): qty 1

## 2018-01-02 MED ORDER — SODIUM CHLORIDE 0.9 % IV BOLUS
500.0000 mL | Freq: Once | INTRAVENOUS | Status: AC
  Administered 2018-01-02: 500 mL via INTRAVENOUS

## 2018-01-02 NOTE — Progress Notes (Signed)
SATURATION QUALIFICATIONS: (This note is used to comply with regulatory documentation for home oxygen)  Patient Saturations on Room Air at Rest = 97%  Patient Saturations on Room Air while Ambulating = 94%  Patient Saturations on 0 Liters of oxygen while Ambulating = 94%  Please briefly explain why patient needs home oxygen: 

## 2018-01-02 NOTE — Progress Notes (Addendum)
PROGRESS NOTE   Terrence Garcia  OVF:[id number] DOB: 02-Dec-1955 DOA: 12/31/2017 PCP: Default, Provider, MD   Brief Narrative:  Terrence Garcia is a 62 y.o. male with medical history significant of  Chronic back pain, hypertension, hyperlipidemia history of stroke, history of BPH and prostate enlargement, history of substance abuse including crack cocaine, and history of tobacco abuse who presented to the emergency room with a chief complaint of shortness of breath.  Patient states that he has been short of breath over a few weeks now.  And states that last week he did cocaine and started feeling more more short of breath after that.  Patient states that he had significant leg swelling as well as swelling in his abdomen and was unable to lie flat.  And states he is very short of breath with exertion.  He denies any cough or chest pain.  However he states that his shortness of breath is his main complaint.  He is transferred over from [facility] for acute CHF exacerbation [facility] was called to admit. ECHO showed severe Low EF so Cardiology was consulted for further evaluation and recommendations.  Radiology has recommended stopping IV Lasix now as patient is euvolemic and his creatinine is worsening and I recommended p.o. Lasix in outpatient setting.  Dr. Meda Coffee of cardiology is recommending no inpatient ischemic work-up but continuing CHF therapy and abstinence from cocaine.  Assessment & Plan:   Active Problems:   Acute CHF (congestive heart failure) (HCC)   CHF (congestive heart failure) (HCC)   BPH (benign prostatic hyperplasia)   Abnormal LFTs   Tobacco abuse   Cocaine abuse (HCC)   Renal insufficiency   HLD (hyperlipidemia)   History of CVA (cerebrovascular accident)  Acute Decompensation of Systolic CHF exacerbation with EF 15% -Admit as an inpatient to telemetry -Likely exacerbated and worsened in the setting of cocaine use -Chest x-ray showed Cardiac enlargement with pulmonary vascular  congestion and bilateral edema. Mild fluid in the fissures. -Patient's BNP on admission is 928.0 -Recent Nuclear Stress Test with an EF of 32% on Stress Test in [facility] -IV Lasix diuresis stopped now that patient's creatinine is worsening.  Was placed on IV 40 mg twice daily.  Per Cardiology will need p.o. 40 mg of Lasix and 25 mg spironolactone daily -Strict I's and O's, daily weights, saline lock IV, and fluid restrict to 1500 cc. -Patient is -4.650 L so far -Checked Echocardiogram and showed Severe global reduction in LV systolic function; elevated LV filling pressure; mild LVH and LVE; trace AI; mildly dilated aortic root; mild MR; moderate LAE; small pericardial effusion with mild RA collapse. -Avoid beta-blockers secondary to acute decompensation as well as cocaine use.  Avoid ACE inhibitors/ARB secondary to renal insufficiency; Per Cardiology-starting patient on Hydralazine 25 mg p.o. 3 times daily and Imdur 30 mg p.o. Daily -Cycle Cardiac Troponins to rule out new ischemia.  Troponins were 0.03 x 3 -Repeat CXR this AM showed Notable improvement in pulmonary edema compared to prior exam. Bibasilar atelectasis. Mild enlargement of the cardiopericardial silhouette -Consulted Cardiology Dr. Meda Coffee for further evaluation and recommendations and as above -Dr. Meda Coffee recommended watching the patient overnight and possible discharge in the morning -We will obtain a ambulatory home O2 screen today  Essential Hypertension -Patient's Blood Pressure was 146/102 this AM -Hold Home Diuretics given renal function and insufficiency -Home Amlodipine 5 mg p.o. Daily now stopped by Cardiology -Patient to be started on Hydralazine 25 mg p.o. 3 times daily and Imdur 30 mg p.o.  daily -Add Hydralazine 10 mg IV q6hprn for SBP >160 or DBP >100  Hyperlipidemia -Checked Lipid Panel and showed a cholesterol level of 180, HDL 57, LDL 113, triglycerides of 48, and VLDL 10 -Continue with Atorvastatin 20 mg p.o.  daily  BPH -Continue Tamsulosin 0.4 mg p.o. after supper  Renal Insufficiency with suspected AKI on CKD?; Unclear Baseline but suspect CKD Stage 3 -Try to avoid nephrotoxic medications -Hold home diuretics -IV Diruesis now stopped and will hold all diuretics given worsening Cr -BUN and creatinine was 34/1.40  -> 31/1.69 -> 26/1.61 -> 33/1.88 -Continue to monitor renal function very carefully and repeat CMP in the a.m.  Abnormal LFTs, improved -Patient's AST on admission was 44 and ALT was 71; Now AST is 22 and ALT is 53 -Obtained Right Upper Quadrant ultrasound and showed Two echogenic liver masses are noted. On the prior CT, there were lesions in the posterior segment the right lobe, measuring up to 6.6 cm in overall greatest dimension. There was also a lesion in the posterior aspect of the lateral segment of the left lobe measuring 4.8 cm. The 3.5 cm echogenic lesion in the right lobe on the current ultrasound corresponds to the larger abnormality seen on the prior CT. The previously seen left lobe lateral segment lesion is not visualized sonographically, likely obscured by bowel gas. There is a 1.9 cm echogenic lesion in the central liver, not definitively seen on the prior CT. These lesions could be further assessed and characterized with liver MRI with and without contrast. There are likely hemangiomas. No acute findings.  Normal gallbladder.  No bile duct dilation. -Can Have Liver Lesions worked up with MRI w/wo Contrast as an outpatient  -Acute Hepatitis Panel negative -Repeat CMP in a.m.  Polysubstance Abuse and Cocaine Abuse -UDS positive was positive for Cocaine -Counseling given  Tobacco Abuse -Smoking cessation counseling given  Hx of CVA/TIA  -Will Add ASA 81 mg po Daily -C/w Atorvastatin 20 mg po Daily  Pulsatile Tinnitus with Hx of Headaches -C/w Acetazolamide 500 mg po BID  -Has taken Fioricet in the Past for Headaches  DVT prophylaxis: Heparin 5,000  units sq q8h Code Status: FULL CODE Family Communication: No family present at bedside  Disposition Plan: Anticipate D/C Home in next 24-48 hours if medically stable  Consultants:   Cardiology Dr. Meda Coffee   Procedures:  ECHOCARDIOGRAM Study Conclusions  - Left ventricle: The cavity size was mildly dilated. Wall   thickness was increased in a pattern of mild LVH. Systolic   function was severely reduced. The estimated ejection fraction   was 15%. Diffuse hypokinesis. Doppler parameters are consistent   with high ventricular filling pressure. - Aortic valve: There was trivial regurgitation. - Aortic root: The aortic root was mildly dilated. - Mitral valve: There was mild regurgitation. - Left atrium: The atrium was moderately dilated. - Pericardium, extracardiac: A small pericardial effusion was   identified.  Impressions:  - Severe global reduction in LV systolic function; elevated LV   filling pressure; mild LVH and LVE; trace AI; mildly dilated   aortic root; mild MR; moderate LAE; small pericardial effusion   with mild RA collapse.  Antimicrobials:  Anti-infectives (From admission, onward)   None     Subjective: Seen and examined   Objective: Vitals:   01/02/18 0500 01/02/18 0535 01/02/18 0849 01/02/18 1009  BP:  110/86  123/81  Pulse:  89  71  Resp:  20  18  Temp:  98 F (36.7 C)  98.5 F (36.9 C)  TempSrc:  Oral  Oral  SpO2:  94% 95% 92%  Weight: 78.1 kg (172 lb 2.9 oz)     Height:        Intake/Output Summary (Last 24 hours) at 01/02/2018 1245 Last data filed at 01/02/2018 1028 Gross per 24 hour  Intake 290 ml  Output 2800 ml  Net -2510 ml   Filed Weights   12/31/17 1400 01/01/18 0500 01/02/18 0500  Weight: 78.3 kg (172 lb 9.9 oz) 75.8 kg (167 lb 1.7 oz) 78.1 kg (172 lb 2.9 oz)   Examination: Physical Exam:  Constitutional: Well-nourished, well-developed African-American male currently no acute distress appears comfortable however still  complaining of some dyspnea Eyes: Sclera anicteric.  Lids and conjunctive are normal. ENMT: External ears and nose appear normal.  Grossly normal hearing. Neck: Appears supple with no JVD Respiratory: Diminished to auscultation bilaterally no appreciable wheezing, rales, rhonchi.  Patient unlabored breathing however still had some dyspnea on exertion per patient. Cardiovascular: Regular rate and rhythm.  No appreciable murmurs, rubs, gallops.  Extremity edema is improved Abdomen: Soft, nontender, nondistended.  Bowel sounds present in 4 quadrants GU: Deferred Musculoskeletal: No contractures or cyanosis.  No joint deformities noted Skin: No appreciable rashes or lesions on limited skin evaluation Neurologic: Renal nerves II through XII grossly intact with no appreciable focal deficits Psychiatric: Normal mood and affect.  Awake, alert, and oriented.  Data Reviewed: I have personally reviewed following labs and imaging studies  CBC: Recent Labs  Lab 12/31/17 0644 12/31/17 1121 01/01/18 0437 01/02/18 0452  WBC 5.1 5.9 6.2 5.9  NEUTROABS 3.4  --  4.1 3.8  HGB 13.3 13.7 14.1 15.1  HCT 38.1* 41.3 43.6 46.0  MCV 78.9 81.5 81.6 82.1  PLT 249 256 259 478   Basic Metabolic Panel: Recent Labs  Lab 12/31/17 0644 12/31/17 1121 12/31/17 1654 01/01/18 0437 01/02/18 0452  NA 140  --  144 142 141  K 3.8  --  3.7 3.5 3.5  CL 111  --  111 112* 110  CO2 19*  --  24 21* 19*  GLUCOSE 111*  --  101* 97 104*  BUN 34*  --  31* 26* 33*  CREATININE 1.40* 1.56* 1.69* 1.61* 1.88*  CALCIUM 8.5*  --  8.6* 8.5* 8.7*  MG  --   --  2.1 2.0 2.2  PHOS  --   --  3.9 4.3 4.5   GFR: Estimated Creatinine Clearance: 40.9 mL/min (A) (by C-G formula based on SCr of 1.88 mg/dL (H)). Liver Function Tests: Recent Labs  Lab 12/31/17 1654 01/01/18 0437 01/02/18 0452  AST 44* 33 22  ALT 71* 64* 53  ALKPHOS 65 62 61  BILITOT 0.4 0.5 0.5  PROT 6.2* 6.3* 6.6  ALBUMIN 2.9* 2.8* 3.0*   No results for  input(s): LIPASE, AMYLASE in the last 168 hours. No results for input(s): AMMONIA in the last 168 hours. Coagulation Profile: No results for input(s): INR, PROTIME in the last 168 hours. Cardiac Enzymes: Recent Labs  Lab 12/31/17 0644 12/31/17 1121 12/31/17 1654 12/31/17 2232  TROPONINI 0.03* 0.03* 0.03* 0.03*   BNP (last 3 results) No results for input(s): PROBNP in the last 8760 hours. HbA1C: No results for input(s): HGBA1C in the last 72 hours. CBG: No results for input(s): GLUCAP in the last 168 hours. Lipid Profile: Recent Labs    01/02/18 0452  CHOL 180  HDL 57  LDLCALC 113*  TRIG 48  CHOLHDL 3.2  Thyroid Function Tests: Recent Labs    12/31/17 1121  TSH 1.178   Anemia Panel: No results for input(s): VITAMINB12, FOLATE, FERRITIN, TIBC, IRON, RETICCTPCT in the last 72 hours. Sepsis Labs: No results for input(s): PROCALCITON, LATICACIDVEN in the last 168 hours.  No results found for this or any previous visit (from the past 240 hour(s)).   Radiology Studies: Dg Chest 2 View  Result Date: 01/01/2018 CLINICAL DATA:  Shortness of breath EXAM: CHEST - 2 VIEW COMPARISON:  12/31/2017 FINDINGS: The interstitial edema and vascular indistinctness shown on the prior exam is considerably improved. Bandlike subsegmental atelectasis in the lingula. A thick band of atelectasis is present in the right middle lobe laterally. Small bilateral pleural effusions blunt the posterior costophrenic angles. Mild enlargement of the cardiopericardial silhouette is present. IMPRESSION: 1. Notable improvement in pulmonary edema compared to prior exam. 2. Bibasilar atelectasis. 3. Mild enlargement of the cardiopericardial silhouette. Electronically Signed   By: Van Clines M.D.   On: 01/01/2018 08:31   Dg Chest Port 1 View  Result Date: 01/02/2018 CLINICAL DATA:  Shortness of breath EXAM: PORTABLE CHEST 1 VIEW COMPARISON:  PA and lateral chest x-ray of Jan 01, 2018 FINDINGS: The lungs  are adequately inflated. Bibasilar densities persist but are less conspicuous. The left hemidiaphragm is better demonstrated. The heart is top-normal in size. The pulmonary vascularity is normal. The mediastinum is normal in width. The trachea is midline. No pleural effusion is evident. The bony thorax is unremarkable. IMPRESSION: Decreasing bibasilar atelectasis. No significant residual pulmonary edema. Electronically Signed   By: David  Martinique M.D.   On: 01/02/2018 07:21   US Abdomen Limited Ruq  Result Date: 01/01/2018 CLINICAL DATA:  Abnormal liver function tests. EXAM: ULTRASOUND ABDOMEN LIMITED RIGHT UPPER QUADRANT COMPARISON:  CT, 04/27/2010. FINDINGS: Gallbladder: No gallstones or wall thickening visualized. No sonographic Murphy sign noted by sonographer. Common bile duct: Diameter: 4 mm Liver: Mildly heterogeneous echogenicity. There is an echogenic mass in the inferior right lobe measuring 3.4 x 2.5 x 3.5 cm. Another mostly echogenic mass lies in the central liver measuring 1.8 x 1.3 x 1.9 cm. No other discrete liver masses. The mass noted along the posterior left lobe on the prior CT is not visualized. Portal vein is patent on color Doppler imaging with normal direction of blood flow towards the liver. Incidental note is made of a small right pleural effusion. IMPRESSION: 1. Two echogenic liver masses are noted. On the prior CT, there were lesions in the posterior segment the right lobe, measuring up to 6.6 cm in overall greatest dimension. There was also a lesion in the posterior aspect of the lateral segment of the left lobe measuring 4.8 cm. The 3.5 cm echogenic lesion in the right lobe on the current ultrasound corresponds to the larger abnormality seen on the prior CT. The previously seen left lobe lateral segment lesion is not visualized sonographically, likely obscured by bowel gas. There is a 1.9 cm echogenic lesion in the central liver, not definitively seen on the prior CT. These lesions  could be further assessed and characterized with liver MRI with and without contrast. There are likely hemangiomas. 2. No acute findings.  Normal gallbladder.  No bile duct dilation. Electronically Signed   By: Lajean Manes M.D.   On: 01/01/2018 11:53   Scheduled Meds: . aspirin EC  81 mg Oral Daily  . atorvastatin  20 mg Oral q1800  . fluticasone  1 spray Each Nare Daily  . heparin  5,000 Units Subcutaneous Q8H  . hydrALAZINE  25 mg Oral TID  . ipratropium-albuterol  3 mL Nebulization BID  . isosorbide mononitrate  30 mg Oral Daily  . nortriptyline  10 mg Oral QHS  . sodium chloride flush  3 mL Intravenous Q12H  . tamsulosin  0.4 mg Oral QPC supper   Continuous Infusions: . sodium chloride      LOS: 2 days   Kerney Elbe, DO [facility] Pager [phone]  If 7PM-7AM, please contact night-coverage [email or web address] Password [facility] 01/02/2018, 12:45 PM

## 2018-01-02 NOTE — Progress Notes (Signed)
Pt ambulated on room air in hallway with RN and nurse extern. PT ambulated 300 ft and started complaining of lightheadedness and dizziness. RN had to assist pt to chair. Upon return to room pt BP 93/62, HR 46. MD notified. 500 cc bolus ordered. Pt stable, resting comfortably in bed.

## 2018-01-02 NOTE — Consult Note (Addendum)
Cardiology Consultation:   Patient ID: Terrence Garcia; [id number]; [birth year]   Admit date: 12/31/2017 Date of Consult: 01/02/2018  Primary Care Provider: Default, Provider, MD Primary Cardiologist:[place]   Patient Profile:   Terrence Garcia is a 62 y.o. male with a hx of stroke, hypertension, hyperlipidemia, TIA, idiopathic intracranial hypertension and polysubstance abuse who is being seen today for the evaluation of low LV function at the request of Dr. Alfredia Ferguson.   Patient states he is remotely seen by cardiologist at [facility].    Last TIA 04/2017.  Stress test at that time showed no evidence of prior infraction or ischemia.  EF was 32%. Echocardiogram showed LV function of 55 to 60%.  No significant valvular abnormality noted.  History of Present Illness:   Terrence Garcia with shortness of breath.  Patient has chronic shortness of breath due to ongoing tobacco smoking.  He says he quit smoking about 3 weeks ago.  Patient also has chronic orthopnea with PND and lower extremity edema.  However after using cocaine Friday 5/24 his shortness of breath, orthopnea and PND progressively got worse and leading to ER evaluation.  He indicates that he use cocaine after "14 years ". He was admitted for acute CHF exacerbation.  BNP was 928 with vascular congestion on checks x-ray.  He is diuresed total 6 L.  His breathing has improved.  Serum creatinine worsen from 1.4 to 1.88 today.  Troponin 0.034.  LDL 113.  TSH normal.  Elevated LFTs.  Abdominal ultrasound showed liver mass concerning for meningioma.  Recommended MRI. Plan  for outpatient work-up.  Echocardiogram this admission showed LV function of 15% with diffuse hypokinesis, mild LVH, moderate LAE, small pericardial effusion with mild RA collapse.  Cardiology is asked for further management.  Chronic intermittent chest tightness with and without exertion.  Occasionally associated with diaphoresis, nausea and questionable  radiation to left arm.  Hard to determine baseline status.  Past Medical History:  Diagnosis Date  . Chronic back pain   . Gastric ulcer   . Hypertension   . Prostate enlargement   . Stroke [facility])     Past Surgical History:  Procedure Laterality Date  . BACK SURGERY      Inpatient Medications: Scheduled Meds: . amLODipine  5 mg Oral Daily  . aspirin EC  81 mg Oral Daily  . atorvastatin  20 mg Oral q1800  . fluticasone  1 spray Each Nare Daily  . furosemide  40 mg Intravenous BID  . heparin  5,000 Units Subcutaneous Q8H  . ipratropium-albuterol  3 mL Nebulization TID  . nortriptyline  10 mg Oral QHS  . sodium chloride flush  3 mL Intravenous Q12H  . tamsulosin  0.4 mg Oral QPC supper   Continuous Infusions: . sodium chloride     PRN Meds: sodium chloride, acetaminophen, albuterol, hydrALAZINE, hydrOXYzine, nitroGLYCERIN, ondansetron (ZOFRAN) IV, oxyCODONE, sodium chloride flush  Allergies:    Allergies  Allergen Reactions  . Iodinated Diagnostic Agents Nausea And Vomiting  . Penicillins Other (See Comments)    Has patient had a PCN reaction causing immediate rash, facial/tongue/throat swelling, SOB or lightheadedness with hypotension: no Has patient had a PCN reaction causing severe rash involving mucus membranes or skin necrosis: no Has patient had a PCN reaction that required hospitalization: no Has patient had a PCN reaction occurring within the last 10 years: No If all of the above answers are "NO", then may proceed with Cephalosporin use. "pt states does nothing for him"  Social History:   Social History   Socioeconomic History  . Marital status: Married    Spouse name: Not on file  . Number of children: Not on file  . Years of education: Not on file  . Highest education level: Not on file  Occupational History  . Not on file  Social Needs  . Financial resource strain: Not on file  . Food insecurity:    Worry: Not on file    Inability: Not on file    . Transportation needs:    Medical: Not on file    Non-medical: Not on file  Tobacco Use  . Smoking status: Current Every Day Smoker  . Smokeless tobacco: Never Used  Substance and Sexual Activity  . Alcohol use: No  . Drug use: Yes    Comment: states sober for 14 years and used crack two days ago   . Sexual activity: Not on file  Lifestyle  . Physical activity:    Days per week: Not on file    Minutes per session: Not on file  . Stress: Not on file  Relationships  . Social connections:    Talks on phone: Not on file    Gets together: Not on file    Attends religious service: Not on file    Active member of club or organization: Not on file    Attends meetings of clubs or organizations: Not on file    Relationship status: Not on file  . Intimate partner violence:    Fear of current or ex partner: Not on file    Emotionally abused: Not on file    Physically abused: Not on file    Forced sexual activity: Not on file  Other Topics Concern  . Not on file  Social History Narrative  . Not on file    Family History:   Maternal aunt has CAD  ROS:  Please see the history of present illness.  All other ROS reviewed and negative.     Physical Exam/Data:   Vitals:   01/01/18 1949 01/01/18 2202 01/02/18 0500 01/02/18 0535  BP:  103/68  110/86  Pulse:  87  89  Resp:  18  20  Temp:  98.1 F (36.7 C)  98 F (36.7 C)  TempSrc:  Oral  Oral  SpO2: 100% 96%  94%  Weight:   172 lb 2.9 oz (78.1 kg)   Height:        Intake/Output Summary (Last 24 hours) at 01/02/2018 0814 Last data filed at 01/02/2018 0500 Gross per 24 hour  Intake 650 ml  Output 3400 ml  Net -2750 ml   Filed Weights   12/31/17 1400 01/01/18 0500 01/02/18 0500  Weight: 172 lb 9.9 oz (78.3 kg) 167 lb 1.7 oz (75.8 kg) 172 lb 2.9 oz (78.1 kg)   Body mass index is 26.18 kg/m.  General:  Well nourished, well developed, in no acute distress HEENT: normal Lymph: no adenopathy Neck: no JVD Endocrine:  No  thryomegaly Vascular: No carotid bruits; FA pulses 2+ bilaterally without bruits  Cardiac:  normal S1, S2; RRR; no murmur Lungs:  clear to auscultation bilaterally, no wheezing, rhonchi or rales  Abd: soft, nontender, no hepatomegaly  Ext: no edema Musculoskeletal:  No deformities, BUE and BLE strength normal and equal Skin: warm and dry  Neuro:  CNs 2-12 intact, no focal abnormalities noted Psych:  Normal affect   EKG:  The EKG was personally reviewed and demonstrates: Sinus rhythm with LVH  criteria T wave inversion in lead V5 V6 Telemetry:  Telemetry was personally reviewed and demonstrates: Sinus rhythm with PVC  Relevant CV Studies:  Transthoracic Echocardiography Report (TTE)  04/19/2017  Summary Ejection fraction is visually estimated at 55-60% Normal size left atrium. No significant Doppler findings. No significant valvular abnormalities. negative bubble study for cardiac shunting Signature ------------------------------------------------------------------------------  Electronically signed by Alinda Money, MD, FACC(Interpreting physician)  on 04/19/2017 05:35 PM ------------------------------------------------------------------------------ Findings Mitral Valve Structurally normal mitral valve with good mobility and no significant regurgitation. Aortic Valve Structurally normal aortic valve with good leaflet mobility, and no regurgitation. Tricuspid Valve Tricuspid valve is structurally normal. No significant tricuspid regurgitation . Pulmonic Valve Pulmonic valve is structurally normal. No Doppler evidence of pulmonic stenosis or insufficiency. Left Atrium Normal size left atrium. Left Ventricle Ejection fraction is visually estimated at 55-60% Right Atrium Normal right atrium. Right Ventricle Normal right ventricular size and function. Pericardial Effusion No evidence of pericardial effusion. Miscellaneous The aorta is within normal limits. The IVC is  normal Negative "bubble study" - no evidence for patent foramen ovale or paradoxical embolization. M-Mode/2D Measurements & Calculations  LV Diastolic Dimension:LV Systolic Dimension: LA Dimension: 2.4 cmAO  4.64 cm3.11 cmRoot Dimension: 3.7 cmLA  LV FS:32.97 %LV Volume Diastolic: Area: 79.0 cm2  LV PW Diastolic: 1.6 WI09.7 ml  Septum Diastolic: 3.53 LV Volume Systolic: 29.9  cm ml LV EDV/LV EDV Index: 79.6 ml/40 m2LV ESV/LV LA/Aorta: 0.65 ESV Index: 29.9 ml/15 m2  LV Area Diastolic: 24.2AS Calculated: 62.44 % LA volume/Index: 35.5 ml  cm2EF Estimated: 62 % /34H9  LV Area Systolic: 62.2 LV Length: 7.54 cm  cm2 Doppler Measurements & Calculations  MV Peak E-Wave: 39.8 cm/s  MV Peak A-Wave: 53.4 cm/s  MV E/A Ratio: 0.75  MV Peak Gradient: 0.63 mmHg  TR Velocity:181 cm/s  MV Deceleration Time: 278 msecTR Gradient:13.1044 mmHg  TDI E' Velocity: 5.9 cm/s  E/E' prime 6.7 [address] [place], [place] [address] [phone]  MYOCARDIAL IMAGING WITH SPECT (REST AND PHARMACOLOGIC-STRESS) 04/21/2017 FINDINGS: Raw images: Mild patient motion artifact is seen on both provided rest and stress images. No significant chest wall or diaphragmatic attenuation is seen on either the rest or stress images.  Perfusion: No decreased activity in the left ventricle on stress imaging to suggest reversible ischemia or infarction.  Wall Motion: The left ventricle is dilated. There is global hypokinesia with relative akinesia involving the septum.  Left Ventricular Ejection Fraction: 32 %  End diastolic volume 814 ml  End systolic volume 481 ml  IMPRESSION: 1. No scintigraphic evidence of prior infarction or pharmacologically induced ischemia. 2. The left ventricle is  dilated with global hypokinesia and relative akinesia involving the septum. 3. Ejection fraction - 32%.   Electronically Signed By: Joaquim Lai M.D. On: 04/21/2017 14:19  Laboratory Data:  Chemistry Recent Labs  Lab 12/31/17 1654 01/01/18 0437 01/02/18 0452  NA 144 142 141  K 3.7 3.5 3.5  CL 111 112* 110  CO2 24 21* 19*  GLUCOSE 101* 97 104*  BUN 31* 26* 33*  CREATININE 1.69* 1.61* 1.88*  CALCIUM 8.6* 8.5* 8.7*  GFRNONAA 43* 45* 37*  GFRAA 49* 52* 43*  ANIONGAP 9 9 12     Recent Labs  Lab 12/31/17 1654 01/01/18 0437 01/02/18 0452  PROT 6.2* 6.3* 6.6  ALBUMIN 2.9* 2.8* 3.0*  AST 44* 33 22  ALT 71* 64* 53  ALKPHOS 65 62 61  BILITOT 0.4 0.5 0.5   Hematology Recent Labs  Lab 12/31/17  1121 01/01/18 0437 01/02/18 0452  WBC 5.9 6.2 5.9  RBC 5.07 5.34 5.60  HGB 13.7 14.1 15.1  HCT 41.3 43.6 46.0  MCV 81.5 81.6 82.1  MCH 27.0 26.4 27.0  MCHC 33.2 32.3 32.8  RDW 14.1 14.2 14.1  PLT 256 259 290   Cardiac Enzymes Recent Labs  Lab 12/31/17 0644 12/31/17 1121 12/31/17 1654 12/31/17 2232  TROPONINI 0.03* 0.03* 0.03* 0.03*   No results for input(s): TROPIPOC in the last 168 hours.  BNP Recent Labs  Lab 12/31/17 0644  BNP 928.0*    DDimer No results for input(s): DDIMER in the last 168 hours.  Radiology/Studies:  Dg Chest 2 View  Result Date: 01/01/2018 CLINICAL DATA:  Shortness of breath EXAM: CHEST - 2 VIEW COMPARISON:  12/31/2017 FINDINGS: The interstitial edema and vascular indistinctness shown on the prior exam is considerably improved. Bandlike subsegmental atelectasis in the lingula. A thick band of atelectasis is present in the right middle lobe laterally. Small bilateral pleural effusions blunt the posterior costophrenic angles. Mild enlargement of the cardiopericardial silhouette is present. IMPRESSION: 1. Notable improvement in pulmonary edema compared to prior exam. 2. Bibasilar atelectasis. 3. Mild enlargement of the cardiopericardial  silhouette. Electronically Signed   By: Van Clines M.D.   On: 01/01/2018 08:31   Dg Chest Port 1 View  Result Date: 01/02/2018 CLINICAL DATA:  Shortness of breath EXAM: PORTABLE CHEST 1 VIEW COMPARISON:  PA and lateral chest x-ray of Jan 01, 2018 FINDINGS: The lungs are adequately inflated. Bibasilar densities persist but are less conspicuous. The left hemidiaphragm is better demonstrated. The heart is top-normal in size. The pulmonary vascularity is normal. The mediastinum is normal in width. The trachea is midline. No pleural effusion is evident. The bony thorax is unremarkable. IMPRESSION: Decreasing bibasilar atelectasis. No significant residual pulmonary edema. Electronically Signed   By: David  Martinique M.D.   On: 01/02/2018 07:21   Dg Chest Port 1 View  Result Date: 12/31/2017 CLINICAL DATA:  Shortness of breath EXAM: PORTABLE CHEST 1 VIEW COMPARISON:  04/19/2017 FINDINGS: Cardiac enlargement. Pulmonary vascular congestion. Perihilar and basal infiltrates consistent with edema. No focal consolidation or volume loss. Small amount of fluid in the fissure. No blunting of costophrenic angles. No pneumothorax. Mediastinal contours appear intact. IMPRESSION: Cardiac enlargement with pulmonary vascular congestion and bilateral edema. Mild fluid in the fissures. Electronically Signed   By: Lucienne Capers M.D.   On: 12/31/2017 06:41   US Abdomen Limited Ruq  Result Date: 01/01/2018 CLINICAL DATA:  Abnormal liver function tests. EXAM: ULTRASOUND ABDOMEN LIMITED RIGHT UPPER QUADRANT COMPARISON:  CT, 04/27/2010. FINDINGS: Gallbladder: No gallstones or wall thickening visualized. No sonographic Murphy sign noted by sonographer. Common bile duct: Diameter: 4 mm Liver: Mildly heterogeneous echogenicity. There is an echogenic mass in the inferior right lobe measuring 3.4 x 2.5 x 3.5 cm. Another mostly echogenic mass lies in the central liver measuring 1.8 x 1.3 x 1.9 cm. No other discrete liver masses.  The mass noted along the posterior left lobe on the prior CT is not visualized. Portal vein is patent on color Doppler imaging with normal direction of blood flow towards the liver. Incidental note is made of a small right pleural effusion. IMPRESSION: 1. Two echogenic liver masses are noted. On the prior CT, there were lesions in the posterior segment the right lobe, measuring up to 6.6 cm in overall greatest dimension. There was also a lesion in the posterior aspect of the lateral segment of the left lobe  measuring 4.8 cm. The 3.5 cm echogenic lesion in the right lobe on the current ultrasound corresponds to the larger abnormality seen on the prior CT. The previously seen left lobe lateral segment lesion is not visualized sonographically, likely obscured by bowel gas. There is a 1.9 cm echogenic lesion in the central liver, not definitively seen on the prior CT. These lesions could be further assessed and characterized with liver MRI with and without contrast. There are likely hemangiomas. 2. No acute findings.  Normal gallbladder.  No bile duct dilation. Electronically Signed   By: Lajean Manes M.D.   On: 01/01/2018 11:53    Assessment and Plan:   1. Acute systolic heart failure - Echocardiogram this admission showed LV function of 15% which is down from 55 to 60% 04/2017.  Patient has chronic shortness of breath, orthopnea, PND and lower extremity edema for many months.  He also endorses substernal chest tightness/pressure.  History hard to determine.  Stress test 04/2017 did not show any evidence of ischemia or infarction. -Diurese 6 L.  Now worsening creatinine.  He is euvolemic by exam.  Will hold Lasix for now.  2.  Acute on presumed chronic kidney disease stage III -Worsening renal function with diuresis.  Hold Lasix and follow renal function closely.  3.  Abnormal LFTs -Abdominal ultrasound showed liver mass.  Recommended of liver.  Hold statin for now.  4.  Hypertension -Elevated on  presentation.  Now stable.  Continue Norvasc.  5. Cocaine abuse - Says used first time in "14 years". Encouraged cessation.  6. Tobacco smoking - says "quit 3 weeks ago" due to dyspnea.    For questions or updates, please contact [facility] Please consult [email or web address] for contact info under Cardiology/STEMI.   Jarrett Soho, [place]  01/02/2018 8:14 AM   The patient was seen, examined and discussed with Bhagat,Bhavinkumar PA-C and I agree with the above.   62 y.o. male with a hx of stroke, hypertension, hyperlipidemia, multiple TIAs, last in 04/2017, idiopathic intracranial hypertension and polysubstance abuse who is being seen today for the evaluation of low LV function at the request of Dr. Alfredia Ferguson.  The patient is a poor historian with flat affect.  Per chart  Underwent a stress test in 04/2017 that was normal - no prior infarct or ischemia. Echocardiogram showed LV function of 55 to 60%.  No significant valvular abnormality noted.  He was admitted on 12/31/17 with acute combined syst and diast CHF - I have personally reviewed his echocardiogram - midl LV dilatation, moderate concentric LVH, LVEF 15%, diffuse hypokinesis, grade 2 diastolic dysfunction with elevated filling pressures, RV EF mildly top moderately decreased.  The patient is a regular cocaine use. He also has CKD stage 3, baseline crea 1.4, now after diuresis 1.88.  This appears as cocaine induced cardiomyopathy, I would no proceed with ischemic workup but focus of CHF therapy and abstinence from cocaine:  Plan:  Hold lasix today as crea up and he appears euvolemic. On discharge start lasix 40 mg po daily and spironolactone 25 mg po daily.  Discontinue amlodipine. Start hydralazine 25 mg po TID and imdur 30 mg po daily.  We will follow.  Ena Dawley, MD 01/02/2018

## 2018-01-03 DIAGNOSIS — I5021 Acute systolic (congestive) heart failure: Secondary | ICD-10-CM

## 2018-01-03 DIAGNOSIS — N183 Chronic kidney disease, stage 3 unspecified: Secondary | ICD-10-CM

## 2018-01-03 DIAGNOSIS — I10 Essential (primary) hypertension: Secondary | ICD-10-CM

## 2018-01-03 DIAGNOSIS — I428 Other cardiomyopathies: Secondary | ICD-10-CM

## 2018-01-03 LAB — COMPREHENSIVE METABOLIC PANEL
ALBUMIN: 2.9 g/dL — AB (ref 3.5–5.0)
ALK PHOS: 61 U/L (ref 38–126)
ALT: 37 U/L (ref 17–63)
ANION GAP: 6 (ref 5–15)
AST: 18 U/L (ref 15–41)
BILIRUBIN TOTAL: 0.4 mg/dL (ref 0.3–1.2)
BUN: 33 mg/dL — AB (ref 6–20)
CALCIUM: 8.5 mg/dL — AB (ref 8.9–10.3)
CO2: 22 mmol/L (ref 22–32)
Chloride: 111 mmol/L (ref 101–111)
Creatinine, Ser: 1.64 mg/dL — ABNORMAL HIGH (ref 0.61–1.24)
GFR calc Af Amer: 51 mL/min — ABNORMAL LOW (ref >60)
GFR calc non Af Amer: 44 mL/min — ABNORMAL LOW (ref >60)
GLUCOSE: 114 mg/dL — AB (ref 65–99)
POTASSIUM: 3.6 mmol/L (ref 3.5–5.1)
SODIUM: 139 mmol/L (ref 135–145)
TOTAL PROTEIN: 6.2 g/dL — AB (ref 6.5–8.1)

## 2018-01-03 LAB — CBC WITH DIFFERENTIAL/PLATELET
BASOS ABS: 0 10*3/uL (ref 0.0–0.1)
BASOS PCT: 0 %
EOS ABS: 0.2 10*3/uL (ref 0.0–0.7)
Eosinophils Relative: 4 %
HEMATOCRIT: 42.9 % (ref 39.0–52.0)
HEMOGLOBIN: 14.2 g/dL (ref 13.0–17.0)
Lymphocytes Relative: 32 %
Lymphs Abs: 1.7 10*3/uL (ref 0.7–4.0)
MCH: 26.6 pg (ref 26.0–34.0)
MCHC: 33.1 g/dL (ref 30.0–36.0)
MCV: 80.3 fL (ref 78.0–100.0)
Monocytes Absolute: 0.4 10*3/uL (ref 0.1–1.0)
Monocytes Relative: 7 %
NEUTROS ABS: 2.9 10*3/uL (ref 1.7–7.7)
NEUTROS PCT: 57 %
Platelets: 278 10*3/uL (ref 150–400)
RBC: 5.34 MIL/uL (ref 4.22–5.81)
RDW: 14.1 % (ref 11.5–15.5)
WBC: 5.2 10*3/uL (ref 4.0–10.5)

## 2018-01-03 LAB — PHOSPHORUS: Phosphorus: 4 mg/dL (ref 2.5–4.6)

## 2018-01-03 LAB — TROPONIN I

## 2018-01-03 LAB — MAGNESIUM: Magnesium: 2.2 mg/dL (ref 1.7–2.4)

## 2018-01-03 MED ORDER — ASPIRIN EC 81 MG PO TBEC: 81.0000 mg | DELAYED_RELEASE_TABLET | Freq: Every day | ORAL | 0 refills | Status: DC

## 2018-01-03 MED ORDER — FUROSEMIDE 40 MG PO TABS: 40.0000 mg | ORAL_TABLET | Freq: Every day | ORAL | 0 refills | Status: DC

## 2018-01-03 MED ORDER — ACETAZOLAMIDE ER 500 MG PO CP12: 500.0000 mg | ORAL_CAPSULE | Freq: Three times a day (TID) | ORAL | 0 refills | Status: DC

## 2018-01-03 MED ORDER — ISOSORBIDE MONONITRATE ER 30 MG PO TB24: 30.0000 mg | ORAL_TABLET | Freq: Every day | ORAL | 0 refills | Status: DC

## 2018-01-03 MED ORDER — SPIRONOLACTONE 25 MG PO TABS: 25.0000 mg | ORAL_TABLET | Freq: Every day | ORAL | 0 refills | Status: DC

## 2018-01-03 MED ORDER — ATORVASTATIN CALCIUM 20 MG PO TABS: 20.0000 mg | ORAL_TABLET | Freq: Every day | ORAL | 0 refills | Status: DC

## 2018-01-03 MED ORDER — FLUTICASONE PROPIONATE 50 MCG/ACT NA SUSP: 1.0000 | Freq: Every day | NASAL | 0 refills | Status: DC

## 2018-01-03 MED ORDER — CARVEDILOL 3.125 MG PO TABS: 3.1250 mg | ORAL_TABLET | Freq: Two times a day (BID) | ORAL | 11 refills | Status: DC

## 2018-01-03 MED ORDER — HYDRALAZINE HCL 25 MG PO TABS: 25.0000 mg | ORAL_TABLET | Freq: Three times a day (TID) | ORAL | 0 refills | Status: DC

## 2018-01-03 MED ORDER — TAMSULOSIN HCL 0.4 MG PO CAPS: 0.4000 mg | ORAL_CAPSULE | Freq: Every day | ORAL | 0 refills | Status: DC

## 2018-01-03 NOTE — Progress Notes (Signed)
Appointment with Dr. Luetta Nutting at 10:00 AM on Monday 01/08/18 was made. Pt was encouraged to keep this appointment and pt agreed to appointment. [facility] selected for [facility] and DME O2.

## 2018-01-03 NOTE — Discharge Summary (Addendum)
Physician Discharge Summary  Terrence Garcia BZJ:[id number] DOB: January 28, 1956 DOA: 12/31/2017  PCP: [facility], [facility]- he has unpaid bills there and will not be going back- we have discussed that it is important for him to have a PCP. He states he will be looking for one. Case management providing him with health connect #.  Admit date: 12/31/2017 Discharge date: 01/03/2018  Admitted From: home Disposition:  home   Recommendations for Outpatient Follow-up:  1. Recommend PFTs  Home Health:  RN ordered Equipment/Devices:  O2    Discharge Condition:  stable   CODE STATUS:  Full code   Consultations:  Cardiology     Discharge Diagnoses:  Principal Problem:   Acute systolic CHF (congestive heart failure) (HCC) Active Problems:   Non-ischemic cardiomyopathy (HCC)   BPH (benign prostatic hyperplasia)   Abnormal LFTs   Cocaine abuse ([place])   HLD (hyperlipidemia)   History of CVA (cerebrovascular accident)   Benign essential HTN   CKD (chronic kidney disease) stage 3, GFR 30-59 ml/min (HCC) Probably COPD- will be on home O2   Brief Summary: Terrence Fulleris a 62 y.o.malewith medical history significant of Chronic back pain, hypertension, hyperlipidemia history of stroke, history of BPH and prostate enlargement, history of substance abuse including crack cocaine, and history of tobacco abuse who presented to the emergency room with a chief complaint of shortness of breath. Patient states that he has been short of breath for a few weeks now. Last week he did crack cocaine and started feeling more short of breath. Patient states that he had significant leg swelling as well as swelling in his abdomen and was unable to lie flat.  He denies any cough or chest pain.     He is transferred over from [facility] for acute CHF exacerbation [facility] was called to admit.      Hospital Course:  Acute Systolic CHF exacerbation with EF 15% (new finding)- pulmonary and pedal edema - non ischemic  cardiomyopathy - in setting of crack cocaine use -Chest x-ray showedCardiac enlargement with pulmonary vascular congestion and bilateral edema. Mild fluid in the fissures. -Patient's BNP on admission is 928.0 - 2 D ECHO: Echocardiogram and showed Severe global reduction in LV systolic function; EF 15 % (down from 55-60% in 9/18)  With diffuse hypokinesis- see report below - cardiology was consulted- Dr. Meda Garcia of cardiology is recommending no inpatient ischemic work-up -Recent Nuclear Stress Test with an EF of 32% on Stress Test in [place] -RepeatCXR after diuresis showed: Notable improvement in pulmonary edema compared to prior exam.  - cardiology recommendations are to d/c with : Hydralazine 25 mg TID, Imdur 30 mg daiy, Lasix 40 mg daily and Spironolactone 25 mg daily - HHRN also ordered to guide at home with teaching and ensuring adherence to medications and diet  Mildly elevated troponin - 0.03  X 3 sets - likely cardiac strain- improved to < 0.03  Essential Hypertension - possibly leading to above- see d/c meds  Crack cocaine use - used on Friday after 14 yrs years of abstinence per patient- he has been counseled and states he will abstain  Hypoxia (chronic?) - still hypoxic to 78% on exertion when ambulating today despite being adequately diuresed - at rest pulse ox is 93% - CT showing apical emphysema -he was a life long smoker who quit 3 wks ago  - will have O2 2 L with portable tank ordered and recommend he have PFTs done to r/u CPOD - congratulated on quitting smoking  CKD 3 - Cr 1.38 in 6/17- has been about 1.6-1.8 here - suspect due to chronic HTN  HLD - LDL 113 - cont lipitor  BPH -ContinueTamsulosin 0.4 mg   Mild elevation in ALT  - 64 improved to 37 ? Hepatic congestion  Possibly hepatic hemangiomas - see abdominal ultrasound below- MRi can be done to further assess as outpt  Hx of CVA/TIA  -Dr Terrence Garcia added ASA 81 mg po Daily -C/w Atorvastatin 20  mg po Daily  Pulsatile Tinnitus with Hx of Headaches -C/w Acetazolamide 500 mg po BID  -Has taken Fioricet in the Past for Headaches   Discharge Exam: Vitals:   01/03/18 0600 01/03/18 0911  BP: 112/81 (!) 136/94  Pulse: 79 92  Resp: 18   Temp: 97.8 F (36.6 C)   SpO2:     Vitals:   01/02/18 1712 01/02/18 2127 01/03/18 0600 01/03/18 0911  BP: 109/86 113/71 112/81 (!) 136/94  Pulse: 85 92 79 92  Resp:  20 18   Temp:  97.7 F (36.5 C) 97.8 F (36.6 C)   TempSrc:  Oral Oral   SpO2:  94%    Weight:   75 kg (165 lb 6.4 oz)   Height:        General: Pt is alert, awake, not in acute distress Cardiovascular: RRR, S1/S2 +, no rubs, no gallops Respiratory: CTA bilaterally, no wheezing, no rhonchi Abdominal: Soft, NT, ND, bowel sounds + Extremities: no edema, no cyanosis   Discharge Instructions  Discharge Instructions    (HEART FAILURE PATIENTS) Call MD:  Anytime you have any of the following symptoms: 1) 3 pound weight gain in 24 hours or 5 pounds in 1 week 2) shortness of breath, with or without a dry hacking cough 3) swelling in the hands, feet or stomach 4) if you have to sleep on extra pillows at night in order to breathe.   Complete by:  As directed    Diet - low sodium heart healthy   Complete by:  As directed    Heart Failure patients record your daily weight using the same scale at the same time of day   Complete by:  As directed    Increase activity slowly   Complete by:  As directed      Allergies as of 01/03/2018      Reactions   Lisinopril Swelling   Iodinated Diagnostic Agents Nausea And Vomiting   Penicillins Other (See Comments)   Has patient had a PCN reaction causing immediate rash, facial/tongue/throat swelling, SOB or lightheadedness with hypotension: no Has patient had a PCN reaction causing severe rash involving mucus membranes or skin necrosis: no Has patient had a PCN reaction that required hospitalization: no Has patient had a PCN reaction  occurring within the last 10 years: No If all of the above answers are "NO", then may proceed with Cephalosporin use. "pt states does nothing for him"      Medication List    STOP taking these medications   amLODipine 5 MG tablet Commonly known as:  NORVASC     TAKE these medications   acetaZOLAMIDE 500 MG capsule Commonly known as:  DIAMOX Take 1 capsule (500 mg total) by mouth 3 (three) times daily.   aspirin EC 81 MG tablet Take 1 tablet (81 mg total) by mouth daily.   atorvastatin 20 MG tablet Commonly known as:  LIPITOR Take 1 tablet (20 mg total) by mouth daily.   carvedilol 3.125 MG tablet Commonly known as:  COREG Take 1 tablet (3.125 mg total) by mouth 2 (two) times daily.   cetirizine 10 MG tablet Commonly known as:  ZYRTEC Take 10 mg by mouth as needed for allergies.   fluticasone 50 MCG/ACT nasal spray Commonly known as:  FLONASE Place 1 spray into both nostrils daily. Start taking on:  01/04/2018 What changed:  how much to take   furosemide 40 MG tablet Commonly known as:  LASIX Take 1 tablet (40 mg total) by mouth daily.   hydrALAZINE 25 MG tablet Commonly known as:  APRESOLINE Take 1 tablet (25 mg total) by mouth 3 (three) times daily. What changed:    when to take this  reasons to take this   isosorbide mononitrate 30 MG 24 hr tablet Commonly known as:  IMDUR Take 1 tablet (30 mg total) by mouth daily. Start taking on:  01/04/2018   omeprazole 40 MG capsule Commonly known as:  PRILOSEC Take 40 mg by mouth daily.   spironolactone 25 MG tablet Commonly known as:  ALDACTONE Take 1 tablet (25 mg total) by mouth daily.   tamsulosin 0.4 MG Caps capsule Commonly known as:  FLOMAX Take 1 capsule (0.4 mg total) by mouth daily after supper. What changed:  when to take this            Durable Medical Equipment  (From admission, onward)        Start     Ordered   01/03/18 1122  For home use only DME oxygen  Once    Question Answer  Comment  Mode or (Route) Nasal cannula   Liters per Minute 2   Frequency Continuous (stationary and portable oxygen unit needed)   Oxygen conserving device Yes   Oxygen delivery system Gas      01/03/18 1121     Follow-up Information    [place] Follow up on 01/08/2018.   Specialty:  Behavioral Health Why:  Appointment Monday 10:00 AM 01/08/18 with Dr. Luetta Nutting, arrive early for paperwork. Take all medications with you. Please kept this appointment.  Contact information: [place] [phone]         Allergies  Allergen Reactions  . Lisinopril Swelling  . Iodinated Diagnostic Agents Nausea And Vomiting  . Penicillins Other (See Comments)    Has patient had a PCN reaction causing immediate rash, facial/tongue/throat swelling, SOB or lightheadedness with hypotension: no Has patient had a PCN reaction causing severe rash involving mucus membranes or skin necrosis: no Has patient had a PCN reaction that required hospitalization: no Has patient had a PCN reaction occurring within the last 10 years: No If all of the above answers are "NO", then may proceed with Cephalosporin use. "pt states does nothing for him"     Procedures/Studies:  ECHOCARDIOGRAM- 01/01/18    - Left ventricle: The cavity size was mildly dilated. Wall thickness was increased in a pattern of mild LVH. Systolic function was severely reduced. The estimated ejection fraction was 15%. Diffuse hypokinesis. Doppler parameters are consistent with high ventricular filling pressure. - Aortic valve: There was trivial regurgitation. - Aortic root: The aortic root was mildly dilated. - Mitral valve: There was mild regurgitation. - Left atrium: The atrium was moderately dilated. - Pericardium, extracardiac: A small pericardial effusion was identified.  Impressions:  - Severe global reduction in LV systolic function;  elevated LV filling pressure; mild LVH and LVE; trace AI; mildly dilated aortic root; mild MR; moderate LAE; small pericardial  effusion with mild RA collapse.    Dg Chest 2 View  Result Date: 01/01/2018 CLINICAL DATA:  Shortness of breath EXAM: CHEST - 2 VIEW COMPARISON:  12/31/2017 FINDINGS: The interstitial edema and vascular indistinctness shown on the prior exam is considerably improved. Bandlike subsegmental atelectasis in the lingula. A thick band of atelectasis is present in the right middle lobe laterally. Small bilateral pleural effusions blunt the posterior costophrenic angles. Mild enlargement of the cardiopericardial silhouette is present. IMPRESSION: 1. Notable improvement in pulmonary edema compared to prior exam. 2. Bibasilar atelectasis. 3. Mild enlargement of the cardiopericardial silhouette. Electronically Signed   By: Van Clines M.D.   On: 01/01/2018 08:31   Ct Chest Wo Contrast  Result Date: 01/02/2018 CLINICAL DATA:  Short of breath EXAM: CT CHEST WITHOUT CONTRAST TECHNIQUE: Multidetector CT imaging of the chest was performed following the standard protocol without IV contrast. COMPARISON:  Ultrasound abdomen 01/01/2018, chest x-ray 01/02/2018. Chest CT 10/21/2012 FINDINGS: Cardiovascular: Small to moderate pericardial effusion anteriorly. Heart size within normal limits. Negative for aortic aneurysm. Limited evaluation without intravenous contrast. Mediastinum/Nodes: Calcified mediastinal lymph nodes compatible with prior granulomatous exposure. No malignant adenopathy. Lungs/Pleura: Moderate to advanced apical emphysema. Mild bibasilar atelectasis/infiltrate. Negative for pleural effusion. Upper Abdomen: Ill-defined liver lesions are identified similar to the prior CT. These were echogenic on ultrasound and may represent hemangiomata. Limited evaluation on unenhanced CT. Musculoskeletal: Negative IMPRESSION: 1. Mild to moderate pericardial effusion 2. Calcific  mediastinal lymph nodes compatible with prior granulomatous exposure. 3. Moderate to advanced apical emphysema. Bibasilar atelectasis/infiltrate is mild. 4. Multiple liver lesions which have been seen previously and may represent hemangiomata. Limited evaluation on the current study. Electronically Signed   By: Franchot Gallo M.D.   On: 01/02/2018 20:31   Dg Chest Port 1 View  Result Date: 01/02/2018 CLINICAL DATA:  Shortness of breath EXAM: PORTABLE CHEST 1 VIEW COMPARISON:  PA and lateral chest x-ray of Jan 01, 2018 FINDINGS: The lungs are adequately inflated. Bibasilar densities persist but are less conspicuous. The left hemidiaphragm is better demonstrated. The heart is top-normal in size. The pulmonary vascularity is normal. The mediastinum is normal in width. The trachea is midline. No pleural effusion is evident. The bony thorax is unremarkable. IMPRESSION: Decreasing bibasilar atelectasis. No significant residual pulmonary edema. Electronically Signed   By: David  Martinique M.D.   On: 01/02/2018 07:21   Dg Chest Port 1 View  Result Date: 12/31/2017 CLINICAL DATA:  Shortness of breath EXAM: PORTABLE CHEST 1 VIEW COMPARISON:  04/19/2017 FINDINGS: Cardiac enlargement. Pulmonary vascular congestion. Perihilar and basal infiltrates consistent with edema. No focal consolidation or volume loss. Small amount of fluid in the fissure. No blunting of costophrenic angles. No pneumothorax. Mediastinal contours appear intact. IMPRESSION: Cardiac enlargement with pulmonary vascular congestion and bilateral edema. Mild fluid in the fissures. Electronically Signed   By: Lucienne Capers M.D.   On: 12/31/2017 06:41   US Abdomen Limited Ruq  Result Date: 01/01/2018 CLINICAL DATA:  Abnormal liver function tests. EXAM: ULTRASOUND ABDOMEN LIMITED RIGHT UPPER QUADRANT COMPARISON:  CT, 04/27/2010. FINDINGS: Gallbladder: No gallstones or wall thickening visualized. No sonographic Murphy sign noted by sonographer. Common  bile duct: Diameter: 4 mm Liver: Mildly heterogeneous echogenicity. There is an echogenic mass in the inferior right lobe measuring 3.4 x 2.5 x 3.5 cm. Another mostly echogenic mass lies in the central liver measuring 1.8 x 1.3 x 1.9 cm. No other discrete liver masses. The mass noted along the posterior left lobe on the  prior CT is not visualized. Portal vein is patent on color Doppler imaging with normal direction of blood flow towards the liver. Incidental note is made of a small right pleural effusion. IMPRESSION: 1. Two echogenic liver masses are noted. On the prior CT, there were lesions in the posterior segment the right lobe, measuring up to 6.6 cm in overall greatest dimension. There was also a lesion in the posterior aspect of the lateral segment of the left lobe measuring 4.8 cm. The 3.5 cm echogenic lesion in the right lobe on the current ultrasound corresponds to the larger abnormality seen on the prior CT. The previously seen left lobe lateral segment lesion is not visualized sonographically, likely obscured by bowel gas. There is a 1.9 cm echogenic lesion in the central liver, not definitively seen on the prior CT. These lesions could be further assessed and characterized with liver MRI with and without contrast. There are likely hemangiomas. 2. No acute findings.  Normal gallbladder.  No bile duct dilation. Electronically Signed   By: Lajean Manes M.D.   On: 01/01/2018 11:53     The results of significant diagnostics from this hospitalization (including imaging, microbiology, ancillary and laboratory) are listed below for reference.     Microbiology: No results found for this or any previous visit (from the past 240 hour(s)).   Labs: BNP (last 3 results) Recent Labs    12/31/17 0644  BNP 938.1*   Basic Metabolic Panel: Recent Labs  Lab 12/31/17 0644 12/31/17 1121 12/31/17 1654 01/01/18 0437 01/02/18 0452 01/03/18 0223  NA 140  --  144 142 141 139  K 3.8  --  3.7 3.5 3.5 3.6   CL 111  --  111 112* 110 111  CO2 19*  --  24 21* 19* 22  GLUCOSE 111*  --  101* 97 104* 114*  BUN 34*  --  31* 26* 33* 33*  CREATININE 1.40* 1.56* 1.69* 1.61* 1.88* 1.64*  CALCIUM 8.5*  --  8.6* 8.5* 8.7* 8.5*  MG  --   --  2.1 2.0 2.2 2.2  PHOS  --   --  3.9 4.3 4.5 4.0   Liver Function Tests: Recent Labs  Lab 12/31/17 1654 01/01/18 0437 01/02/18 0452 01/03/18 0223  AST 44* 33 22 18  ALT 71* 64* 53 37  ALKPHOS 65 62 61 61  BILITOT 0.4 0.5 0.5 0.4  PROT 6.2* 6.3* 6.6 6.2*  ALBUMIN 2.9* 2.8* 3.0* 2.9*   No results for input(s): LIPASE, AMYLASE in the last 168 hours. No results for input(s): AMMONIA in the last 168 hours. CBC: Recent Labs  Lab 12/31/17 0644 12/31/17 1121 01/01/18 0437 01/02/18 0452 01/03/18 0223  WBC 5.1 5.9 6.2 5.9 5.2  NEUTROABS 3.4  --  4.1 3.8 2.9  HGB 13.3 13.7 14.1 15.1 14.2  HCT 38.1* 41.3 43.6 46.0 42.9  MCV 78.9 81.5 81.6 82.1 80.3  PLT 249 256 259 290 278   Cardiac Enzymes: Recent Labs  Lab 12/31/17 1654 12/31/17 2232 01/02/18 1525 01/02/18 2039 01/03/18 0223  TROPONINI 0.03* 0.03* <0.03 <0.03 <0.03   BNP: Invalid input(s): POCBNP CBG: No results for input(s): GLUCAP in the last 168 hours. D-Dimer No results for input(s): DDIMER in the last 72 hours. Hgb A1c No results for input(s): HGBA1C in the last 72 hours. Lipid Profile Recent Labs    01/02/18 0452  CHOL 180  HDL 57  LDLCALC 113*  TRIG 48  CHOLHDL 3.2   Thyroid function studies No results  for input(s): TSH, T4TOTAL, T3FREE, THYROIDAB in the last 72 hours.  Invalid input(s): FREET3 Anemia work up No results for input(s): VITAMINB12, FOLATE, FERRITIN, TIBC, IRON, RETICCTPCT in the last 72 hours. Urinalysis No results found for: COLORURINE, APPEARANCEUR, LABSPEC, [place], GLUCOSEU, HGBUR, BILIRUBINUR, KETONESUR, PROTEINUR, UROBILINOGEN, NITRITE, LEUKOCYTESUR Sepsis Labs Invalid input(s): PROCALCITONIN,  WBC,  LACTICIDVEN Microbiology No results found for this  or any previous visit (from the past 240 hour(s)).   Time coordinating discharge in minutes: 65  SIGNED:   Debbe Odea, MD  [facility] 01/03/2018, 12:42 PM Pager   If 7PM-7AM, please contact night-coverage [email or web address] Password [email or web address]

## 2018-01-03 NOTE — Progress Notes (Signed)
Discharge instructions reviewed with patient and family members and all verbalize understanding.  Patient taken via wheelchair to personal vehicle by NT.

## 2018-01-03 NOTE — Discharge Instructions (Signed)
discontinued  Heart Failure Heart failure means your heart has trouble pumping blood. This makes it hard for your body to work well. Heart failure is usually a long-term (chronic) condition. You must take good care of yourself and follow your doctor's treatment plan. Follow these instructions at home:  Take your heart medicine as told by your doctor. ? Do not stop taking medicine unless your doctor tells you to. ? Do not skip any dose of medicine. ? Refill your medicines before they run out. ? Take other medicines only as told by your doctor or pharmacist.  Stay active if told by your doctor. The elderly and people with severe heart failure should talk with a doctor about physical activity.  Eat heart-healthy foods. Choose foods that are without trans fat and are low in saturated fat, cholesterol, and salt (sodium). This includes fresh or frozen fruits and vegetables, fish, lean meats, fat-free or low-fat dairy foods, whole grains, and high-fiber foods. Lentils and dried peas and beans (legumes) are also good choices.  Limit salt if told by your doctor.  Cook in a healthy way. Roast, grill, broil, bake, poach, steam, or stir-fry foods.  Limit fluids as told by your doctor.  Weigh yourself every morning. Do this after you pee (urinate) and before you eat breakfast. Write down your weight to give to your doctor.  Take your blood pressure and write it down if your doctor tells you to.  Ask your doctor how to check your pulse. Check your pulse as told.  Lose weight if told by your doctor.  Stop smoking or chewing tobacco. Do not use gum or patches that help you quit without your doctor's approval.  Schedule and go to doctor visits as told.  Nonpregnant women should have no more than 1 drink a day. Men should have no more than 2 drinks a day. Talk to your doctor about drinking alcohol.  Stop illegal drug use.  Stay current with shots (immunizations).  Manage your health conditions  as told by your doctor.  Learn to manage your stress.  Rest when you are tired.  If it is really hot outside: ? Avoid intense activities. ? Use air conditioning or fans, or get in a cooler place. ? Avoid caffeine and alcohol. ? Wear loose-fitting, lightweight, and light-colored clothing.  If it is really cold outside: ? Avoid intense activities. ? Layer your clothing. ? Wear mittens or gloves, a hat, and a scarf when going outside. ? Avoid alcohol.  Learn about heart failure and get support as needed.  Get help to maintain or improve your quality of life and your ability to care for yourself as needed. Contact a doctor if:  You gain weight quickly.  You are more short of breath than usual.  You cannot do your normal activities.  You tire easily.  You cough more than normal, especially with activity.  You have any or more puffiness (swelling) in areas such as your hands, feet, ankles, or belly (abdomen).  You cannot sleep because it is hard to breathe.  You feel like your heart is beating fast (palpitations).  You get dizzy or light-headed when you stand up. Get help right away if:  You have trouble breathing.  There is a change in mental status, such as becoming less alert or not being able to focus.  You have chest pain or discomfort.  You faint. This information is not intended to replace advice given to you by your health care provider. Make  sure you discuss any questions you have with your health care provider. Document Released: 05/03/2008 Document Revised: 12/31/2015 Document Reviewed: 09/10/2012 Elsevier Interactive Patient Education  2017 [place].   Heart Failure and Exercise Heart failure is a condition in which the heart does not fill or pump enough blood and oxygen to support your body and its functions. Heart failure is a long-term (chronic) condition. Living with heart failure can be challenging. However, following your health care provider's  instructions about a healthy lifestyle may help improve your symptoms. This includes choosing the right exercise plan. Doing daily physical activity is important after a diagnosis of heart failure. You may have some activity restrictions, so talk to your health care provider before doing any exercises. What are the benefits of exercise? Exercise may:  Make your heart muscles stronger.  Lower your blood pressure.  Lower your cholesterol.  Help you lose weight.  Help your bones stay strong.  Improve your blood circulation.  Help your body use oxygen better. This relieves symptoms such as fatigue and shortness of breath.  Help your mental health by lowering the risk of depression and other problems.  Improve your quality of life.  Decrease your chance of hospital admission for heart failure.  What is an exercise plan? An exercise plan is a set of specific exercises and training activities. You will work with your health care provider to create the exercise plan that works for you. The plan may include:  Different types of exercises and how to do them.  Cardiac rehabilitation exercises. These are supervised programs that are designed to strengthen your heart.  What are strengthening exercises? Strengthening exercises are a type of physical activity that involves using resistance to improve your muscle strength. Strengthening exercises usually have repetitive motions. These types of exercises can include:  Lifting weights.  Using weight machines.  Using resistance tubes and bands.  Using kettlebells.  Using your body weight, such as doing push-ups or squats.  What are balance exercises? Balance exercises are another type of physical activity. They strengthen the muscles of the back, stomach, and pelvis (core muscles) and improve your balance. They can also lower your risk of falling. These types of exercises can include:  Standing on one leg.  Walking backward, sideways,  and in a straight line.  Standing up after sitting, without using your hands.  Shifting your weight from one leg to the other.  Lifting one leg in front of you.  Doing tai chi. This is a type of exercise that uses slow movements and deep breathing.  How can I increase my flexibility? Having better flexibility can keep you from falling. It can also lengthen your muscles, improve your range of motion, and help your joints. You can increase your flexibility by:  Doing tai chi.  Doing yoga.  Stretching.  How much aerobic exercise should I get? Aerobic exercises strengthen your breathing and circulation system and increase your body's use of oxygen. Examples of aerobic exercise include biking, walking, running, and swimming. Talk to your health care provider to find out how much aerobic exercise is safe for you.  To do these exercises:  Start exercising slowly, limiting the amount of time at first. You may need to start with 5 minutes of aerobic exercise every day.  Slowly add more minutes until you can safely do at least 30 minutes of exercise at least 4 days a week.  Summary  Daily physical activity is important after a diagnosis of heart failure.  Exercise can make your heart muscles stronger. It also offers other benefits that will improve your health.  Talk to your health care provider before doing any exercises. This information is not intended to replace advice given to you by your health care provider. Make sure you discuss any questions you have with your health care provider. Document Released: 12/06/2016 Document Revised: 12/06/2016 Document Reviewed: 12/06/2016 Elsevier Interactive Patient Education  2018 [place] Eating Plan DASH stands for "Dietary Approaches to Stop Hypertension." The DASH eating plan is a healthy eating plan that has been shown to reduce high blood pressure (hypertension). It may also reduce your risk for type 2 diabetes, heart disease, and  stroke. The DASH eating plan may also help with weight loss. What are tips for following this plan? General guidelines  Avoid eating more than 2,300 mg (milligrams) of salt (sodium) a day. If you have hypertension, you may need to reduce your sodium intake to 1,500 mg a day.  Limit alcohol intake to no more than 1 drink a day for nonpregnant women and 2 drinks a day for men. One drink equals 12 oz of beer, 5 oz of wine, or 1 oz of hard liquor.  Work with your health care provider to maintain a healthy body weight or to lose weight. Ask what an ideal weight is for you.  Get at least 30 minutes of exercise that causes your heart to beat faster (aerobic exercise) most days of the week. Activities may include walking, swimming, or biking.  Work with your health care provider or diet and nutrition specialist (dietitian) to adjust your eating plan to your individual calorie needs. Reading food labels  Check food labels for the amount of sodium per serving. Choose foods with less than 5 percent of the Daily Value of sodium. Generally, foods with less than 300 mg of sodium per serving fit into this eating plan.  To find whole grains, look for the word "whole" as the first word in the ingredient list. Shopping  Buy products labeled as "low-sodium" or "no salt added."  Buy fresh foods. Avoid canned foods and premade or frozen meals. Cooking  Avoid adding salt when cooking. Use salt-free seasonings or herbs instead of table salt or sea salt. Check with your health care provider or pharmacist before using salt substitutes.  Do not fry foods. Cook foods using healthy methods such as baking, boiling, grilling, and broiling instead.  Cook with heart-healthy oils, such as olive, canola, soybean, or sunflower oil. Meal planning   Eat a balanced diet that includes: ? 5 or more servings of fruits and vegetables each day. At each meal, try to fill half of your plate with fruits and vegetables. ? Up  to 6-8 servings of whole grains each day. ? Less than 6 oz of lean meat, poultry, or fish each day. A 3-oz serving of meat is about the same size as a deck of cards. One egg equals 1 oz. ? 2 servings of low-fat dairy each day. ? A serving of nuts, seeds, or beans 5 times each week. ? Heart-healthy fats. Healthy fats called Omega-3 fatty acids are found in foods such as flaxseeds and coldwater fish, like sardines, salmon, and mackerel.  Limit how much you eat of the following: ? Canned or prepackaged foods. ? Food that is high in trans fat, such as fried foods. ? Food that is high in saturated fat, such as fatty meat. ? Sweets, desserts, sugary drinks, and other foods with  added sugar. ? Full-fat dairy products.  Do not salt foods before eating.  Try to eat at least 2 vegetarian meals each week.  Eat more home-cooked food and less restaurant, buffet, and fast food.  When eating at a restaurant, ask that your food be prepared with less salt or no salt, if possible. What foods are recommended? The items listed may not be a complete list. Talk with your dietitian about what dietary choices are best for you. Grains Whole-grain or whole-wheat bread. Whole-grain or whole-wheat pasta. Brown rice. [place]. Bulgur. Whole-grain and low-sodium cereals. Pita bread. Low-fat, low-sodium crackers. Whole-wheat flour tortillas. Vegetables Fresh or frozen vegetables (raw, steamed, roasted, or grilled). Low-sodium or reduced-sodium tomato and vegetable juice. Low-sodium or reduced-sodium tomato sauce and tomato paste. Low-sodium or reduced-sodium canned vegetables. Fruits All fresh, dried, or frozen fruit. Canned fruit in natural juice (without added sugar). Meat and other protein foods Skinless chicken or [place]. Ground chicken or [place]. Pork with fat trimmed off. Fish and seafood. Egg whites. Dried beans, peas, or lentils. Unsalted nuts, nut butters, and seeds. Unsalted canned beans. Lean cuts of  beef with fat trimmed off. Low-sodium, lean deli meat. Dairy Low-fat (1%) or fat-free (skim) milk. Fat-free, low-fat, or reduced-fat cheeses. Nonfat, low-sodium ricotta or cottage cheese. Low-fat or nonfat yogurt. Low-fat, low-sodium cheese. Fats and oils Soft margarine without trans fats. Vegetable oil. Low-fat, reduced-fat, or light mayonnaise and salad dressings (reduced-sodium). Canola, safflower, olive, soybean, and sunflower oils. Avocado. Seasoning and other foods Herbs. Spices. Seasoning mixes without salt. Unsalted popcorn and pretzels. Fat-free sweets. What foods are not recommended? The items listed may not be a complete list. Talk with your dietitian about what dietary choices are best for you. Grains Baked goods made with fat, such as croissants, muffins, or some breads. Dry pasta or rice meal packs. Vegetables Creamed or fried vegetables. Vegetables in a cheese sauce. Regular canned vegetables (not low-sodium or reduced-sodium). Regular canned tomato sauce and paste (not low-sodium or reduced-sodium). Regular tomato and vegetable juice (not low-sodium or reduced-sodium). Angie Fava. Olives. Fruits Canned fruit in a light or heavy syrup. Fried fruit. Fruit in cream or butter sauce. Meat and other protein foods Fatty cuts of meat. Ribs. Fried meat. Berniece Salines. Sausage. Bologna and other processed lunch meats. Salami. Fatback. Hotdogs. Bratwurst. Salted nuts and seeds. Canned beans with added salt. Canned or smoked fish. Whole eggs or egg yolks. Chicken or [place] with skin. Dairy Whole or 2% milk, cream, and half-and-half. Whole or full-fat cream cheese. Whole-fat or sweetened yogurt. Full-fat cheese. Nondairy creamers. Whipped toppings. Processed cheese and cheese spreads. Fats and oils Butter. Stick margarine. Lard. Shortening. Ghee. Bacon fat. Tropical oils, such as coconut, palm kernel, or palm oil. Seasoning and other foods Salted popcorn and pretzels. Onion salt, garlic salt, seasoned  salt, table salt, and sea salt. Worcestershire sauce. Tartar sauce. Barbecue sauce. Teriyaki sauce. Soy sauce, including reduced-sodium. Steak sauce. Canned and packaged gravies. Fish sauce. Oyster sauce. Cocktail sauce. Horseradish that you find on the shelf. Ketchup. Mustard. Meat flavorings and tenderizers. Bouillon cubes. Hot sauce and Tabasco sauce. Premade or packaged marinades. Premade or packaged taco seasonings. Relishes. Regular salad dressings. Where to find more information:  National Heart, Lung, and [place]: https://wilson-eaton.com/  American Heart Association: www.heart.org Summary  The DASH eating plan is a healthy eating plan that has been shown to reduce high blood pressure (hypertension). It may also reduce your risk for type 2 diabetes, heart disease, and stroke.  With the DASH  eating plan, you should limit salt (sodium) intake to 2,300 mg a day. If you have hypertension, you may need to reduce your sodium intake to 1,500 mg a day.  When on the DASH eating plan, aim to eat more fresh fruits and vegetables, whole grains, lean proteins, low-fat dairy, and heart-healthy fats.  Work with your health care provider or diet and nutrition specialist (dietitian) to adjust your eating plan to your individual calorie needs. This information is not intended to replace advice given to you by your health care provider. Make sure you discuss any questions you have with your health care provider. Document Released: 07/14/2011 Document Revised: 07/18/2016 Document Reviewed: 07/18/2016 Elsevier Interactive Patient Education  [employer].

## 2018-01-03 NOTE — Progress Notes (Signed)
SATURATION QUALIFICATIONS: (This note is used to comply with regulatory documentation for home oxygen)  Patient Saturations on Room Air at Rest = 96%  Patient Saturations on Room Air while Ambulating = 78%  Patient Saturations on 2 Liters of oxygen while Ambulating = 98%  Please briefly explain why patient needs home oxygen:  Desat to 78% while ambulating on room air

## 2018-01-03 NOTE — Progress Notes (Addendum)
Progress Note  Patient Name: Terrence Garcia Date of Encounter: 01/03/2018  Primary Cardiologist: Romelle Starcher  Subjective   Feeling well. No chest pain, sob or palpitations.   Inpatient Medications    Scheduled Meds: . aspirin EC  81 mg Oral Daily  . atorvastatin  20 mg Oral q1800  . fluticasone  1 spray Each Nare Daily  . heparin  5,000 Units Subcutaneous Q8H  . hydrALAZINE  25 mg Oral TID  . isosorbide mononitrate  30 mg Oral Daily  . nortriptyline  10 mg Oral QHS  . pantoprazole  40 mg Oral Daily  . sodium chloride flush  3 mL Intravenous Q12H  . tamsulosin  0.4 mg Oral QPC supper   Continuous Infusions: . sodium chloride     PRN Meds: sodium chloride, acetaminophen, albuterol, hydrOXYzine, nitroGLYCERIN, ondansetron (ZOFRAN) IV, oxyCODONE, sodium chloride flush   Vital Signs    Vitals:   01/02/18 1712 01/02/18 2127 01/03/18 0600 01/03/18 0911  BP: 109/86 113/71 112/81 (!) 136/94  Pulse: 85 92 79 92  Resp:  20 18   Temp:  97.7 F (36.5 C) 97.8 F (36.6 C)   TempSrc:  Oral Oral   SpO2:  94%    Weight:   165 lb 6.4 oz (75 kg)   Height:        Intake/Output Summary (Last 24 hours) at 01/03/2018 1339 Last data filed at 01/03/2018 0916 Gross per 24 hour  Intake 1363 ml  Output 1000 ml  Net 363 ml   Filed Weights   01/01/18 0500 01/02/18 0500 01/03/18 0600  Weight: 167 lb 1.7 oz (75.8 kg) 172 lb 2.9 oz (78.1 kg) 165 lb 6.4 oz (75 kg)    Telemetry    SR with PVCs - Personally Reviewed  ECG    N/A  Physical Exam   GEN: No acute distress.   Neck: No JVD Cardiac: RRR, no murmurs, rubs, or gallops.  Respiratory: Clear to auscultation bilaterally. GI: Soft, nontender, non-distended  MS: No edema; No deformity. Neuro:  Nonfocal  Psych: Normal affect   Labs    Chemistry Recent Labs  Lab 01/01/18 0437 01/02/18 0452 01/03/18 0223  NA 142 141 139  K 3.5 3.5 3.6  CL 112* 110 111  CO2 21* 19* 22  GLUCOSE 97 104* 114*  BUN 26* 33* 33*  CREATININE  1.61* 1.88* 1.64*  CALCIUM 8.5* 8.7* 8.5*  PROT 6.3* 6.6 6.2*  ALBUMIN 2.8* 3.0* 2.9*  AST 33 22 18  ALT 64* 53 37  ALKPHOS 62 61 61  BILITOT 0.5 0.5 0.4  GFRNONAA 45* 37* 44*  GFRAA 52* 43* 51*  ANIONGAP 9 12 6      Hematology Recent Labs  Lab 01/01/18 0437 01/02/18 0452 01/03/18 0223  WBC 6.2 5.9 5.2  RBC 5.34 5.60 5.34  HGB 14.1 15.1 14.2  HCT 43.6 46.0 42.9  MCV 81.6 82.1 80.3  MCH 26.4 27.0 26.6  MCHC 32.3 32.8 33.1  RDW 14.2 14.1 14.1  PLT 259 290 278    Cardiac Enzymes Recent Labs  Lab 12/31/17 2232 01/02/18 1525 01/02/18 2039 01/03/18 0223  TROPONINI 0.03* <0.03 <0.03 <0.03   No results for input(s): TROPIPOC in the last 168 hours.   BNP Recent Labs  Lab 12/31/17 0644  BNP 928.0*     DDimer No results for input(s): DDIMER in the last 168 hours.   Radiology    Ct Chest Wo Contrast  Result Date: 01/02/2018 CLINICAL DATA:  Short of breath EXAM:  CT CHEST WITHOUT CONTRAST TECHNIQUE: Multidetector CT imaging of the chest was performed following the standard protocol without IV contrast. COMPARISON:  Ultrasound abdomen 01/01/2018, chest x-ray 01/02/2018. Chest CT 10/21/2012 FINDINGS: Cardiovascular: Small to moderate pericardial effusion anteriorly. Heart size within normal limits. Negative for aortic aneurysm. Limited evaluation without intravenous contrast. Mediastinum/Nodes: Calcified mediastinal lymph nodes compatible with prior granulomatous exposure. No malignant adenopathy. Lungs/Pleura: Moderate to advanced apical emphysema. Mild bibasilar atelectasis/infiltrate. Negative for pleural effusion. Upper Abdomen: Ill-defined liver lesions are identified similar to the prior CT. These were echogenic on ultrasound and may represent hemangiomata. Limited evaluation on unenhanced CT. Musculoskeletal: Negative IMPRESSION: 1. Mild to moderate pericardial effusion 2. Calcific mediastinal lymph nodes compatible with prior granulomatous exposure. 3. Moderate to advanced  apical emphysema. Bibasilar atelectasis/infiltrate is mild. 4. Multiple liver lesions which have been seen previously and may represent hemangiomata. Limited evaluation on the current study. Electronically Signed   By: Franchot Gallo M.D.   On: 01/02/2018 20:31   Dg Chest Port 1 View  Result Date: 01/02/2018 CLINICAL DATA:  Shortness of breath EXAM: PORTABLE CHEST 1 VIEW COMPARISON:  PA and lateral chest x-ray of Jan 01, 2018 FINDINGS: The lungs are adequately inflated. Bibasilar densities persist but are less conspicuous. The left hemidiaphragm is better demonstrated. The heart is top-normal in size. The pulmonary vascularity is normal. The mediastinum is normal in width. The trachea is midline. No pleural effusion is evident. The bony thorax is unremarkable. IMPRESSION: Decreasing bibasilar atelectasis. No significant residual pulmonary edema. Electronically Signed   By: David  Martinique M.D.   On: 01/02/2018 07:21    Cardiac Studies   Echocardiogram 5/272019 Left ventricle: The cavity size was mildly dilated. Wall   thickness was increased in a pattern of mild LVH. Systolic   function was severely reduced. The estimated ejection fraction   was 15%. Diffuse hypokinesis. Doppler parameters are consistent   with high ventricular filling pressure. - Aortic valve: There was trivial regurgitation. - Aortic root: The aortic root was mildly dilated. - Mitral valve: There was mild regurgitation. - Left atrium: The atrium was moderately dilated. - Pericardium, extracardiac: A small pericardial effusion was   identified.  Impressions:  - Severe global reduction in LV systolic function; elevated LV   filling pressure; mild LVH and LVE; trace AI; mildly dilated   aortic root; mild MR; moderate LAE; small pericardial effusion   with mild RA collapse.  Patient Profile     Terrence Garcia is a 62 y.o. male with a hx of stroke, hypertension, hyperlipidemia, TIA, idiopathic intracranial hypertension  and polysubstance abuse  presented with SOB and found to have new low LVEF of 15%.   Last TIA 04/2017.  Stress test at that time showed no evidence of prior infraction or ischemia, EF was 32%. Echocardiogram showed LV function of 55 to 60%.  No significant valvular abnormality noted. This work up was done at [facility].   Assessment & Plan    1. Acute combined systolic and diastolic heart failure - Echocardiogram this admission showed LV function of 15% which is down from 55 to 60% 04/2017.  Patient has chronic shortness of breath, orthopnea, PND and lower extremity edema for many months. 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 No ischemic work up planned. Normal stress test 04/2017. -Diuresed total 5.6 L. Diuretics was held yesterday due to rising creatinine>>improved today.  -Continue hydralazine and  Imdur. Plan to start lasix and spironolactone at discharge. No ACE/ARB due to allergy and CKD.   2.  Acute on presumed chronic kidney disease stage III -Renal function improving with holding lasix  3.  Liver mass -Recommended MRI of liver. Plan for outpatient.   4.  Hypertension -Elevated on presentation.  Now stable. Meds as above.   5. Polysubstance abuse -  Encouraged cessation.  Will need CM and SW consult for discharge need.  He has pending bills at [facility] that he can not pay off. Consider establish care in community clinic.   For questions or updates, please contact [place] Please consult [email or web address] for contact info under Cardiology/STEMI.     Signed, Ena Dawley, MD  01/03/2018, 1:39 PM    The patient was seen, examined and discussed with Bhagat,Bhavinkumar PA-C and I agree with the above.   62 y.o. male with a hx of stroke, hypertension, hyperlipidemia, multiple TIAs, last in 04/2017, idiopathic intracranial hypertension and polysubstance abuse who is being seen today for the evaluation of low LV function at the request of Dr. Alfredia Ferguson.  The patient is a poor historian  with flat affect.  Per chart  Underwent a stress test in 04/2017 that was normal - no prior infarct or ischemia. Echocardiogram showed LV function of 55 to 60%.  No significant valvular abnormality noted.  He was admitted on 12/31/17 with acute combined syst and diast CHF - I have personally reviewed his echocardiogram - midl LV dilatation, moderate concentric LVH, LVEF 15%, diffuse hypokinesis, grade 2 diastolic dysfunction with elevated filling pressures, RV EF mildly top moderately decreased.  The patient is a regular cocaine use. He also has CKD stage 3, baseline crea 1.4, now after diuresis 1.88.  This appears as cocaine induced cardiomyopathy, I would no proceed with ischemic workup but focus of CHF therapy and abstinence from cocaine:  Plan:  Continue to hold lasix today, Crea improving, he appears euvolemic. On discharge start lasix 40 mg po daily and spironolactone 25 mg po daily.  Continue hydralazine 25 mg po TID and imdur 30 mg po daily. No betablockers as he is cocaine user. We will arrange for outpatient follow up.  Ena Dawley, MD 01/03/2018

## 2018-01-03 NOTE — Care Management Important Message (Signed)
Important Message  Patient Details  Name: Terrence Garcia MRN: [id number] Date of Birth: 1956/08/03   Medicare Important Message Given:  Yes    Whitley, Strycharz 01/03/2018, 11:51 AMImportant Message  Patient Details  Name: Terrence Garcia MRN: [id number] Date of Birth: Mar 26, 1956   Medicare Important Message Given:  Yes    Curran, Lenderman 01/03/2018, 11:51 AM

## 2018-01-06 ENCOUNTER — Other Ambulatory Visit: Payer: Self-pay

## 2018-01-06 ENCOUNTER — Encounter (HOSPITAL_BASED_OUTPATIENT_CLINIC_OR_DEPARTMENT_OTHER): Payer: Self-pay | Admitting: Emergency Medicine

## 2018-01-06 ENCOUNTER — Emergency Department (HOSPITAL_BASED_OUTPATIENT_CLINIC_OR_DEPARTMENT_OTHER): Payer: Medicare Other

## 2018-01-06 ENCOUNTER — Emergency Department (HOSPITAL_BASED_OUTPATIENT_CLINIC_OR_DEPARTMENT_OTHER)
Admission: EM | Admit: 2018-01-06 | Discharge: 2018-01-06 | Discharge status: Against Medical Advice | Payer: Medicare Other | Attending: Physician Assistant | Admitting: Physician Assistant

## 2018-01-06 DIAGNOSIS — I5020 Unspecified systolic (congestive) heart failure: Secondary | ICD-10-CM | POA: Insufficient documentation

## 2018-01-06 DIAGNOSIS — F17200 Nicotine dependence, unspecified, uncomplicated: Secondary | ICD-10-CM | POA: Insufficient documentation

## 2018-01-06 DIAGNOSIS — N183 Chronic kidney disease, stage 3 (moderate): Secondary | ICD-10-CM | POA: Insufficient documentation

## 2018-01-06 DIAGNOSIS — I130 Hypertensive heart and chronic kidney disease with heart failure and stage 1 through stage 4 chronic kidney disease, or unspecified chronic kidney disease: Secondary | ICD-10-CM | POA: Insufficient documentation

## 2018-01-06 DIAGNOSIS — Z8673 Personal history of transient ischemic attack (TIA), and cerebral infarction without residual deficits: Secondary | ICD-10-CM | POA: Diagnosis not present

## 2018-01-06 DIAGNOSIS — R1013 Epigastric pain: Secondary | ICD-10-CM | POA: Insufficient documentation

## 2018-01-06 HISTORY — DX: Heart failure, unspecified: I50.9

## 2018-01-06 MED ORDER — GI COCKTAIL ~~LOC~~
30.0000 mL | Freq: Once | ORAL | Status: DC
  Filled 2018-01-06: qty 30

## 2018-01-06 NOTE — ED Notes (Signed)
Pt to desk stating "I need to go get my oxygen." Pt has home O2 PRN. Pt noted to be breathing heavier than before, SpO2 99% on RA. RT placed pt on 2LPM [place]. EKG done. MD aware.

## 2018-01-06 NOTE — ED Notes (Signed)
ED Provider at bedside. 

## 2018-01-06 NOTE — ED Notes (Signed)
Pt removed himself from the monitor, dressed and left.

## 2018-01-06 NOTE — ED Provider Notes (Signed)
[facility] EMERGENCY DEPARTMENT Provider Note   CSN: [id number] Arrival date & time: 01/06/18  0947     History   Chief Complaint Chief Complaint  Patient presents with  . Abdominal Pain    HPI Terrence Garcia is a 62 y.o. male.  HPI   Patient is a 62 year old male past medical history significant for CHF, newly diagnosed with an EF of 15%.,  Chronic crack cocaine use, smoker, history of gastric ulcers.  Chronic pain.  Patient had recent hospitalization.  Was discharged several days ago.  In his hospitalization he was found to have new Lee lowered EF, old hemangiomas noted on ultrasound of abdomen.  Patient reports being compliant with all medications he was discharged on.  He reports that he ate some bad peanut butter that tasted funny on Wednesday night.  He said he got a new package on Thursday which tasted fine.  However he reports he had mild nausea.  He reports every time he eats something he has pain in his epigastric area.  No vomiting no diarrhea.  No abdominal pain to palpation.  He reports is aching/burning.  He feels like the same time he had a gastric ulcer before.  He is tried Pepto-Bismol which has helped.  Past Medical History:  Diagnosis Date  . CHF (congestive heart failure) ([place])   . Chronic back pain   . Gastric ulcer   . Hypertension   . Prostate enlargement   . Stroke [facility])     Patient Active Problem List   Diagnosis Date Noted  . Non-ischemic cardiomyopathy ([place]) 01/03/2018  . Acute systolic CHF (congestive heart failure) ([place]) 01/03/2018  . Benign essential HTN 01/03/2018  . CKD (chronic kidney disease) stage 3, GFR 30-59 ml/min (HCC) 01/03/2018  . BPH (benign prostatic hyperplasia) 12/31/2017  . Abnormal LFTs 12/31/2017  . Cocaine abuse ([place]) 12/31/2017  . HLD (hyperlipidemia) 12/31/2017  . History of CVA (cerebrovascular accident) 12/31/2017    Past Surgical History:  Procedure Laterality Date  . BACK SURGERY          Home  Medications    Prior to Admission medications   Medication Sig Start Date End Date Taking? Authorizing Provider  acetaZOLAMIDE (DIAMOX) 500 MG capsule Take 1 capsule (500 mg total) by mouth 3 (three) times daily. 01/03/18   Debbe Odea, MD  aspirin EC 81 MG tablet Take 1 tablet (81 mg total) by mouth daily. 01/03/18   Debbe Odea, MD  atorvastatin (LIPITOR) 20 MG tablet Take 1 tablet (20 mg total) by mouth daily. 01/03/18   Debbe Odea, MD  carvedilol (COREG) 3.125 MG tablet Take 1 tablet (3.125 mg total) by mouth 2 (two) times daily. 01/03/18 01/03/19  Debbe Odea, MD  cetirizine (ZYRTEC) 10 MG tablet Take 10 mg by mouth as needed for allergies.    [provider]  fluticasone (FLONASE) 50 MCG/ACT nasal spray Place 1 spray into both nostrils daily. 01/04/18   Debbe Odea, MD  furosemide (LASIX) 40 MG tablet Take 1 tablet (40 mg total) by mouth daily. 01/03/18 01/03/19  Debbe Odea, MD  hydrALAZINE (APRESOLINE) 25 MG tablet Take 1 tablet (25 mg total) by mouth 3 (three) times daily. 01/03/18   Debbe Odea, MD  isosorbide mononitrate (IMDUR) 30 MG 24 hr tablet Take 1 tablet (30 mg total) by mouth daily. 01/04/18   Debbe Odea, MD  omeprazole (PRILOSEC) 40 MG capsule Take 40 mg by mouth daily.    [provider]  spironolactone (ALDACTONE) 25 MG  tablet Take 1 tablet (25 mg total) by mouth daily. 01/03/18 01/03/19  Debbe Odea, MD  tamsulosin (FLOMAX) 0.4 MG CAPS capsule Take 1 capsule (0.4 mg total) by mouth daily after supper. 01/03/18   Debbe Odea, MD    Family History No family history on file.  Social History Social History   Tobacco Use  . Smoking status: Current Every Day Smoker  . Smokeless tobacco: Never Used  Substance Use Topics  . Alcohol use: No  . Drug use: Yes    Comment: states sober for 14 years and used crack two days ago      Allergies   Lisinopril; Iodinated diagnostic agents; and Penicillins   Review of Systems Review of Systems    Constitutional: Negative for fatigue and fever.  Respiratory: Negative for apnea and shortness of breath.   Gastrointestinal: Positive for abdominal pain and nausea.  All other systems reviewed and are negative.    Physical Exam Updated Vital Signs BP 127/82 (BP Location: Left Arm)   Pulse 80   Temp 98.2 F (36.8 C) (Oral)   Resp 18   Ht 5\' 8"  (1.727 m)   Wt 78.2 kg (172 lb 8 oz)   SpO2 99%   BMI 26.23 kg/m   Physical Exam  Constitutional: He is oriented to person, place, and time. He appears well-nourished.  HENT:  Head: Normocephalic.  Eyes: Conjunctivae are normal.  Cardiovascular: Normal rate.  Pulmonary/Chest: Effort normal.  Abdominal: Normal appearance and bowel sounds are normal. He exhibits no distension. There is no tenderness. There is no CVA tenderness.  Neurological: He is oriented to person, place, and time.  Skin: Skin is warm and dry. He is not diaphoretic.  No edema  Psychiatric: He has a normal mood and affect. His behavior is normal.     ED Treatments / Results  Labs (all labs ordered are listed, but only abnormal results are displayed) Labs Reviewed - No data to display  EKG None  Radiology No results found.  Procedures Procedures (including critical care time)  Medications Ordered in ED Medications - No data to display   Initial Impression / Assessment and Plan / ED Course  I have reviewed the triage vital signs and the nursing notes.  Pertinent labs & imaging results that were available during my care of the patient were reviewed by me and considered in my medical decision making (see chart for details).     Patient is a 62 year old male past medical history significant for CHF, newly diagnosed with an EF of 15%.,  Chronic crack cocaine use, smoker, history of gastric ulcers.  Chronic pain.  Patient had recent hospitalization.  Was discharged several days ago.  In his hospitalization he was found to have new Lee lowered EF, old  hemangiomas noted on ultrasound of abdomen.  Patient reports being compliant with all medications he was discharged on.  He reports that he ate some bad peanut butter that tasted funny on Wednesday night.  He said he got a new package on Thursday which tasted fine.  However he reports he had mild nausea.  He reports every time he eats something he has pain in his epigastric area.  No vomiting no diarrhea.  No abdominal pain to palpation.  He reports is aching/burning.  He feels like the same time he had a gastric ulcer before.  He is tried Pepto-Bismol which has helped.  10:39 AM Patient has no tenderness on exam.  Patient had normal right upper quadrant ultrasound  done within the last week.  I believe this likely represents a gastritis.  Either from the stress of hospitalization versus a food intake that was suspect.  Will give GI cocktail here.  Will check baseline labs.  Will start patient on omeprazole and follow-up with gastroenterology.  Patietn left the ED without telling MD or nursing anything.   Final Clinical Impressions(s) / ED Diagnoses   Final diagnoses:  None    ED Discharge Orders    None       Carissa Musick, Fredia Sorrow, MD 01/06/18 1241

## 2018-01-06 NOTE — ED Triage Notes (Signed)
Pt reports abd pain and vomiting x 2 days. Recently d/c from [facility] for CHF

## 2018-01-08 ENCOUNTER — Ambulatory Visit (INDEPENDENT_AMBULATORY_CARE_PROVIDER_SITE_OTHER): Payer: Medicare Other | Admitting: Family Medicine

## 2018-01-08 ENCOUNTER — Encounter: Payer: Self-pay | Admitting: Family Medicine

## 2018-01-08 VITALS — BP 120/90 | HR 68 | Ht 68.0 in | Wt 177.0 lb

## 2018-01-08 DIAGNOSIS — I10 Essential (primary) hypertension: Secondary | ICD-10-CM | POA: Diagnosis not present

## 2018-01-08 DIAGNOSIS — R350 Frequency of micturition: Secondary | ICD-10-CM

## 2018-01-08 DIAGNOSIS — I5021 Acute systolic (congestive) heart failure: Secondary | ICD-10-CM | POA: Diagnosis not present

## 2018-01-08 DIAGNOSIS — N401 Enlarged prostate with lower urinary tract symptoms: Secondary | ICD-10-CM | POA: Diagnosis not present

## 2018-01-08 DIAGNOSIS — F1410 Cocaine abuse, uncomplicated: Secondary | ICD-10-CM

## 2018-01-08 DIAGNOSIS — R159 Full incontinence of feces: Secondary | ICD-10-CM | POA: Diagnosis not present

## 2018-01-08 LAB — POCT URINALYSIS DIPSTICK
Bilirubin, UA: NEGATIVE
GLUCOSE UA: NEGATIVE
Ketones, UA: NEGATIVE
Leukocytes, UA: NEGATIVE
Nitrite, UA: NEGATIVE
PH UA: 7 (ref 5.0–8.0)
Protein, UA: POSITIVE — AB
RBC UA: NEGATIVE
Spec Grav, UA: 1.015 (ref 1.010–1.025)
UROBILINOGEN UA: 0.2 U/dL

## 2018-01-08 LAB — COMPREHENSIVE METABOLIC PANEL
ALT: 21 U/L (ref 0–53)
AST: 18 U/L (ref 0–37)
Albumin: 3.5 g/dL (ref 3.5–5.2)
Alkaline Phosphatase: 61 U/L (ref 39–117)
BILIRUBIN TOTAL: 0.4 mg/dL (ref 0.2–1.2)
BUN: 16 mg/dL (ref 6–23)
CALCIUM: 9 mg/dL (ref 8.4–10.5)
CO2: 31 meq/L (ref 19–32)
Chloride: 104 mEq/L (ref 96–112)
Creatinine, Ser: 1.22 mg/dL (ref 0.40–1.50)
GFR: 77.95 mL/min (ref >60.00)
GLUCOSE: 107 mg/dL — AB (ref 70–99)
POTASSIUM: 4.1 meq/L (ref 3.5–5.1)
Sodium: 140 mEq/L (ref 135–145)
Total Protein: 6.3 g/dL (ref 6.0–8.3)

## 2018-01-08 LAB — PSA: PSA: 2.23 ng/mL (ref 0.10–4.00)

## 2018-01-08 LAB — CBC
HCT: 43.8 % (ref 39.0–52.0)
HEMOGLOBIN: 14.3 g/dL (ref 13.0–17.0)
MCHC: 32.8 g/dL (ref 30.0–36.0)
MCV: 81.7 fl (ref 78.0–100.0)
PLATELETS: 268 10*3/uL (ref 150.0–400.0)
RBC: 5.35 Mil/uL (ref 4.22–5.81)
RDW: 14.2 % (ref 11.5–15.5)
WBC: 5.6 10*3/uL (ref 4.0–10.5)

## 2018-01-08 MED ORDER — OMEPRAZOLE 40 MG PO CPDR: 40.0000 mg | DELAYED_RELEASE_CAPSULE | Freq: Two times a day (BID) | ORAL | 1 refills | Status: DC

## 2018-01-08 NOTE — Assessment & Plan Note (Signed)
Bp is fairly well controlled currently Continue current medication Limit salt intake 1500mg /day

## 2018-01-08 NOTE — Progress Notes (Signed)
Terrence Garcia - 62 y.o. male MRN [id number]  Date of birth: 1956-05-29  Subjective Chief Complaint  Patient presents with  . Hospitalization Follow-up    HPI Terrence Garcia is a 62 y.o. male with a history of cardiomyopathy/CHF, BPH, GERD, TIA, Hypertension, substance abuse and depression here today to establish care with new pcp and for hospital follow up.  He was hospitalized in late May for complaint of shortness of breath, found to have CHF with LVEF of 15% and diffuse hypokinesis.  He was started on medications with improvement of symptoms.  Returned to ED on 6/1 with c/o nausea, worse after eating.  Given GI cocktail with improvement but then left AMA.  Reports that now he has had some dark stools.  Also report fecal incontinence with urination, however states that this has been occurring for several months.  He believes that he had a colonoscopy 4-5 years ago when he was incarcerated.  He states that he has been compliant with medications since discharge from hospital, he continues to use home O2.  He has abstained from using crack and cigarettes for the past week although he states that his wife continues to use crack.  He is looking to move to a different living situation to get out of that environment.   He denies chest pain, palpitations, dizziness/lightheaded feeling, increase shortness of breath, swelling, weight gain, fever, chills.   ROS: ROS completed and negative except as noted per HPI    Allergies  Allergen Reactions  . Lisinopril Swelling  . Iodinated Diagnostic Agents Nausea And Vomiting  . Penicillins Other (See Comments)    Has patient had a PCN reaction causing immediate rash, facial/tongue/throat swelling, SOB or lightheadedness with hypotension: no Has patient had a PCN reaction causing severe rash involving mucus membranes or skin necrosis: no Has patient had a PCN reaction that required hospitalization: no Has patient had a PCN reaction occurring within the last 10  years: No If all of the above answers are "NO", then may proceed with Cephalosporin use. "pt states does nothing for him"    Past Medical History:  Diagnosis Date  . CHF (congestive heart failure) ([place])   . Chronic back pain   . Gastric ulcer   . Hypertension   . Prostate enlargement   . Stroke [facility])     Past Surgical History:  Procedure Laterality Date  . BACK SURGERY      Social History   Socioeconomic History  . Marital status: Married    Spouse name: Not on file  . Number of children: Not on file  . Years of education: Not on file  . Highest education level: Not on file  Occupational History  . Not on file  Social Needs  . Financial resource strain: Not on file  . Food insecurity:    Worry: Not on file    Inability: Not on file  . Transportation needs:    Medical: Not on file    Non-medical: Not on file  Tobacco Use  . Smoking status: Former Smoker    Last attempt to quit: 01/01/2018    Years since quitting: 0.0  . Smokeless tobacco: Never Used  Substance and Sexual Activity  . Alcohol use: No  . Drug use: Yes    Comment: states sober for 14 years and used crack two days ago   . Sexual activity: Not on file  Lifestyle  . Physical activity:    Days per week: Not on file  Minutes per session: Not on file  . Stress: Not on file  Relationships  . Social connections:    Talks on phone: Not on file    Gets together: Not on file    Attends religious service: Not on file    Active member of club or organization: Not on file    Attends meetings of clubs or organizations: Not on file    Relationship status: Not on file  Other Topics Concern  . Not on file  Social History Narrative  . Not on file    History reviewed. No pertinent family history.  Health Maintenance  Topic Date Due  . Hepatitis C Screening  04-18-1956  . HIV Screening  02/16/1973  . TETANUS/TDAP  02/16/1977  . COLONOSCOPY  02/17/2008  . INFLUENZA VACCINE  03/08/2018     ----------------------------------------------------------------------------------------------------------------------------------------------------------------------------------------------------------------- Physical Exam BP 120/90   Pulse 68   Ht 5\' 8"  (1.727 m)   Wt 177 lb (80.3 kg)   BMI 26.91 kg/m   Physical Exam  Constitutional: He is oriented to person, place, and time. He appears well-nourished. No distress.  HENT:  Head: Normocephalic and atraumatic.  Mouth/Throat: Oropharynx is clear and moist.  Eyes: No scleral icterus.  Neck: Neck supple. No thyromegaly present.  Cardiovascular: Normal rate, regular rhythm, normal heart sounds and intact distal pulses.  Pulmonary/Chest: Effort normal and breath sounds normal. No respiratory distress. He has no wheezes.  Abdominal: Soft. Bowel sounds are normal. He exhibits no distension. There is tenderness (mild epigastric ttp). There is no guarding.  Musculoskeletal: He exhibits no edema.  Lymphadenopathy:    He has no cervical adenopathy.  Neurological: He is alert and oriented to person, place, and time.  Skin: Skin is warm and dry. No rash noted.  Psychiatric: He has a normal mood and affect. His behavior is normal.    ------------------------------------------------------------------------------------------------------------------------------------------------------------------------------------------------------------------- Assessment and Plan  Benign essential HTN Bp is fairly well controlled currently Continue current medication Limit salt intake 1500mg /day  Acute systolic CHF (congestive heart failure) (HCC) EF is significantly reduced Discussed importance of compliance to medication Avoid nicotine products/crack Continue current medications Check daily weights Keep upcoming appt with cardiology  BPH (benign prostatic hyperplasia) Continue tamsulosin Check PSA  Cocaine abuse ([place]) Encouraged to continue  abstaining from crack use.   Incontinence of feces Seen recently with epigastric pain, now with dark stools concerning for GI bleeding Start omeprazole bid Check CBC Referral to GI.

## 2018-01-08 NOTE — Assessment & Plan Note (Signed)
Encouraged to continue abstaining from crack use.

## 2018-01-08 NOTE — Assessment & Plan Note (Signed)
Seen recently with epigastric pain, now with dark stools concerning for GI bleeding Start omeprazole bid Check CBC Referral to GI.

## 2018-01-08 NOTE — Patient Instructions (Signed)
It was nice to meet you Start omeprazole 40mg  twice per day Keep an eye on your weight Keep appt with cardiology I will see you back in 6 weeks    Heart Failure Heart failure means your heart has trouble pumping blood. This makes it hard for your body to work well. Heart failure is usually a long-term (chronic) condition. You must take good care of yourself and follow your doctor's treatment plan. Follow these instructions at home:  Take your heart medicine as told by your doctor. ? Do not stop taking medicine unless your doctor tells you to. ? Do not skip any dose of medicine. ? Refill your medicines before they run out. ? Take other medicines only as told by your doctor or pharmacist.  Stay active if told by your doctor. The elderly and people with severe heart failure should talk with a doctor about physical activity.  Eat heart-healthy foods. Choose foods that are without trans fat and are low in saturated fat, cholesterol, and salt (sodium). This includes fresh or frozen fruits and vegetables, fish, lean meats, fat-free or low-fat dairy foods, whole grains, and high-fiber foods. Lentils and dried peas and beans (legumes) are also good choices.  Limit salt if told by your doctor.  Cook in a healthy way. Roast, grill, broil, bake, poach, steam, or stir-fry foods.  Limit fluids as told by your doctor.  Weigh yourself every morning. Do this after you pee (urinate) and before you eat breakfast. Write down your weight to give to your doctor.  Take your blood pressure and write it down if your doctor tells you to.  Ask your doctor how to check your pulse. Check your pulse as told.  Lose weight if told by your doctor.  Stop smoking or chewing tobacco. Do not use gum or patches that help you quit without your doctor's approval.  Schedule and go to doctor visits as told.  Nonpregnant women should have no more than 1 drink a day. Men should have no more than 2 drinks a day. Talk to  your doctor about drinking alcohol.  Stop illegal drug use.  Stay current with shots (immunizations).  Manage your health conditions as told by your doctor.  Learn to manage your stress.  Rest when you are tired.  If it is really hot outside: ? Avoid intense activities. ? Use air conditioning or fans, or get in a cooler place. ? Avoid caffeine and alcohol. ? Wear loose-fitting, lightweight, and light-colored clothing.  If it is really cold outside: ? Avoid intense activities. ? Layer your clothing. ? Wear mittens or gloves, a hat, and a scarf when going outside. ? Avoid alcohol.  Learn about heart failure and get support as needed.  Get help to maintain or improve your quality of life and your ability to care for yourself as needed. Contact a doctor if:  You gain weight quickly.  You are more short of breath than usual.  You cannot do your normal activities.  You tire easily.  You cough more than normal, especially with activity.  You have any or more puffiness (swelling) in areas such as your hands, feet, ankles, or belly (abdomen).  You cannot sleep because it is hard to breathe.  You feel like your heart is beating fast (palpitations).  You get dizzy or light-headed when you stand up. Get help right away if:  You have trouble breathing.  There is a change in mental status, such as becoming less alert or not  being able to focus.  You have chest pain or discomfort.  You faint. This information is not intended to replace advice given to you by your health care provider. Make sure you discuss any questions you have with your health care provider. Document Released: 05/03/2008 Document Revised: 12/31/2015 Document Reviewed: 09/10/2012 Elsevier Interactive Patient Education  2017 [employer].

## 2018-01-08 NOTE — Assessment & Plan Note (Signed)
Continue tamsulosin Check PSA

## 2018-01-08 NOTE — Assessment & Plan Note (Signed)
EF is significantly reduced Discussed importance of compliance to medication Avoid nicotine products/crack Continue current medications Check daily weights Keep upcoming appt with cardiology

## 2018-01-09 ENCOUNTER — Encounter: Payer: Self-pay | Admitting: Physician Assistant

## 2018-01-16 ENCOUNTER — Telehealth: Payer: Self-pay | Admitting: Family Medicine

## 2018-01-16 NOTE — Telephone Encounter (Signed)
Please advise 

## 2018-01-16 NOTE — Telephone Encounter (Signed)
Ok to give VO for SW assessment

## 2018-01-16 NOTE — Telephone Encounter (Signed)
Terrence Garcia is aware.

## 2018-01-16 NOTE — Telephone Encounter (Signed)
Copied from [place] [id number]. Topic: Quick Communication - See Telephone Encounter >> Jan 16, 2018  9:23 AM Boyd Kerbs wrote: CRM for notification. See Telephone encounter for: 01/16/18.  [place] [phone] needing verbal order for social worker for assessment .   Having housing issues.

## 2018-01-17 ENCOUNTER — Ambulatory Visit: Payer: Medicare Other | Admitting: Cardiology

## 2018-01-23 ENCOUNTER — Ambulatory Visit: Payer: Medicare Other | Admitting: Physician Assistant

## 2018-02-13 ENCOUNTER — Ambulatory Visit: Payer: Medicare Other | Admitting: Gastroenterology

## 2018-02-16 DIAGNOSIS — R2689 Other abnormalities of gait and mobility: Secondary | ICD-10-CM | POA: Insufficient documentation

## 2018-02-19 ENCOUNTER — Ambulatory Visit: Payer: Medicare Other | Admitting: Family Medicine

## 2018-02-22 NOTE — Progress Notes (Deleted)
Cardiology Office Note   Date:  02/22/2018   ID:  Terrence Garcia, DOB 1955/08/15, MRN [id number]  PCP:  Luetta Nutting, DO  Cardiologist:  [place]     No chief complaint on file.     History of Present Illness: Terrence Garcia is a 62 y.o. male who presents for post hospital for HF, and EF 15%, diuresed.   hx ofstroke, hypertension, hyperlipidemia, TIA, idiopathic intracranial hypertension and polysubstance abuse presented with SOB and found to have new low LVEF of 15%.   Last TIA 04/2017. Stress test at that time showed no evidence of prior infraction or ischemia, EF was 32%. Echocardiogram showed LV function of 55 to 60%. No significant valvular abnormality noted. This work up was done at [facility].   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  Was neg 5.6 L at discharge Liver mass.  HATN no inpt ischemic workup Hydralazine 25 mg TID, Imdur 30 mg daiy, Lasix 40 mg daily and Spironolactone 25 mg daily    Past Medical History:  Diagnosis Date  . CHF (congestive heart failure) ([place])   . Chronic back pain   . Gastric ulcer   . Hypertension   . Prostate enlargement   . Stroke [facility])     Past Surgical History:  Procedure Laterality Date  . BACK SURGERY       Current Outpatient Medications  Medication Sig Dispense Refill  . carvedilol (COREG) 3.125 MG tablet Take 1 tablet (3.125 mg total) by mouth 2 (two) times daily. 60 tablet 11  . furosemide (LASIX) 40 MG tablet Take 1 tablet (40 mg total) by mouth daily. 30 tablet 0  . hydrALAZINE (APRESOLINE) 25 MG tablet Take 1 tablet (25 mg total) by mouth 3 (three) times daily. 90 tablet 0  . isosorbide mononitrate (IMDUR) 30 MG 24 hr tablet Take 1 tablet (30 mg total) by mouth daily. 30 tablet 0  . omeprazole (PRILOSEC) 40 MG capsule Take 1 capsule (40 mg total) by mouth 2 (two) times daily. 60 capsule 1  . spironolactone (ALDACTONE) 25 MG tablet Take 1 tablet (25 mg total) by mouth daily. 30 tablet 0  . tamsulosin (FLOMAX)  0.4 MG CAPS capsule Take 1 capsule (0.4 mg total) by mouth daily after supper. 30 capsule 0   No current facility-administered medications for this visit.     Allergies:   Lisinopril; Iodinated diagnostic agents; and Penicillins    Social History:  The patient  reports that he quit smoking about 7 weeks ago. He has never used smokeless tobacco. He reports that he has current or past drug history. He reports that he does not drink alcohol.   Family History:  The patient's ***family history is not on file.    ROS:  General:no colds or fevers, no weight changes Skin:no rashes or ulcers HEENT:no blurred vision, no congestion CV:see HPI PUL:see HPI GI:no diarrhea constipation or melena, no indigestion GU:no hematuria, no dysuria MS:no joint pain, no claudication Neuro:no syncope, no lightheadedness Endo:no diabetes, no thyroid disease Wt Readings from Last 3 Encounters:  01/08/18 177 lb (80.3 kg)  01/06/18 172 lb 8 oz (78.2 kg)  01/03/18 165 lb 6.4 oz (75 kg)     PHYSICAL EXAM: VS:  There were no vitals taken for this visit. , BMI There is no height or weight on file to calculate BMI. General:Pleasant affect, NAD Skin:Warm and dry, brisk capillary refill HEENT:normocephalic, sclera clear, mucus membranes moist Neck:supple, no JVD, no bruits  Heart:S1S2 RRR without murmur, gallup, rub or  click Lungs:clear without rales, rhonchi, or wheezes GMW:NUUV, non tender, + BS, do not palpate liver spleen or masses Ext:no lower ext edema, 2+ pedal pulses, 2+ radial pulses Neuro:alert and oriented, MAE, follows commands, + facial symmetry    EKG:  EKG is ordered today. The ekg ordered today demonstrates ***   Recent Labs: 12/31/2017: B Natriuretic Peptide 928.0; TSH 1.178 01/03/2018: Magnesium 2.2 01/08/2018: ALT 21; BUN 16; Creatinine, Ser 1.22; Hemoglobin 14.3; Platelets 268.0; Potassium 4.1; Sodium 140    Lipid Panel    Component Value Date/Time   CHOL 180 01/02/2018 0452   TRIG  48 01/02/2018 0452   HDL 57 01/02/2018 0452   CHOLHDL 3.2 01/02/2018 0452   VLDL 10 01/02/2018 0452   LDLCALC 113 (H) 01/02/2018 0452       Other studies Reviewed: Additional studies/ records that were reviewed today include: ***.   ASSESSMENT AND PLAN:  1.  ***   Current medicines are reviewed with the patient today.  The patient Has no concerns regarding medicines.  The following changes have been made:  See above Labs/ tests ordered today include:see above  Disposition:   FU:  see above  Signed, Cecilie Kicks, NP  02/22/2018 10:15 PM    [facility] [place], [place], [place], [place] Phone: [phone]; Fax: [phone]

## 2018-02-23 ENCOUNTER — Ambulatory Visit: Payer: Medicare Other | Admitting: Cardiology

## 2018-02-26 ENCOUNTER — Telehealth: Payer: Self-pay

## 2018-02-26 NOTE — Telephone Encounter (Signed)
Copied from [place] [id number]. Topic: Quick Communication - See Telephone Encounter >> Jan 16, 2018  9:23 AM Boyd Kerbs wrote: CRM for notification. See Telephone encounter for: 01/16/18.  [place] [phone] needing verbal order for social worker for accessment .   Having housing issues. >> Feb 26, 2018  1:59 PM Oneta Rack wrote: [name] name: Terrence Garcia  Relation to pt: patient care manager / Nurse from [facility]  Call back number: [phone]  Pharmacy:  Reason for call:  Just FYI -   Discharging patient no longer home bound

## 2018-03-05 ENCOUNTER — Ambulatory Visit: Payer: Medicare Other | Admitting: Family Medicine

## 2018-04-11 NOTE — Progress Notes (Deleted)
Cardiology Office Note   Date:  04/11/2018   ID:  Terrence Garcia, DOB 1956-06-13, MRN [id number]  PCP:  Luetta Nutting, DO  Cardiologist:  Dr. Dewitt Rota    No chief complaint on file.     History of Present Illness: Terrence Garcia is a 62 y.o. male who presents for ***  a hx ofstroke, hypertension, hyperlipidemia, TIA, idiopathic intracranial hypertension and polysubstance abuse presented with SOB and found to have new low LVEF of 15%.   Last TIA 04/2017. Stress test at that time showed no evidence of prior infraction or ischemia, EF was 32%. Echocardiogram showed LV function of 55 to 60%. No significant valvular abnormality noted. This work up was done at [facility].   Past Medical History:  Diagnosis Date  . CHF (congestive heart failure) ([place])   . Chronic back pain   . Gastric ulcer   . Hypertension   . Prostate enlargement   . Stroke [facility])     Past Surgical History:  Procedure Laterality Date  . BACK SURGERY       Current Outpatient Medications  Medication Sig Dispense Refill  . carvedilol (COREG) 3.125 MG tablet Take 1 tablet (3.125 mg total) by mouth 2 (two) times daily. 60 tablet 11  . furosemide (LASIX) 40 MG tablet Take 1 tablet (40 mg total) by mouth daily. 30 tablet 0  . hydrALAZINE (APRESOLINE) 25 MG tablet Take 1 tablet (25 mg total) by mouth 3 (three) times daily. 90 tablet 0  . isosorbide mononitrate (IMDUR) 30 MG 24 hr tablet Take 1 tablet (30 mg total) by mouth daily. 30 tablet 0  . omeprazole (PRILOSEC) 40 MG capsule Take 1 capsule (40 mg total) by mouth 2 (two) times daily. 60 capsule 1  . spironolactone (ALDACTONE) 25 MG tablet Take 1 tablet (25 mg total) by mouth daily. 30 tablet 0  . tamsulosin (FLOMAX) 0.4 MG CAPS capsule Take 1 capsule (0.4 mg total) by mouth daily after supper. 30 capsule 0   No current facility-administered medications for this visit.     Allergies:   Lisinopril; Iodinated diagnostic agents; and Penicillins     Social History:  The patient  reports that he quit smoking about 3 months ago. He has never used smokeless tobacco. He reports that he has current or past drug history. He reports that he does not drink alcohol.   Family History:  The patient's ***family history is not on file.    ROS:  General:no colds or fevers, no weight changes Skin:no rashes or ulcers HEENT:no blurred vision, no congestion CV:see HPI PUL:see HPI GI:no diarrhea constipation or melena, no indigestion GU:no hematuria, no dysuria MS:no joint pain, no claudication Neuro:no syncope, no lightheadedness Endo:no diabetes, no thyroid disease Wt Readings from Last 3 Encounters:  01/08/18 177 lb (80.3 kg)  01/06/18 172 lb 8 oz (78.2 kg)  01/03/18 165 lb 6.4 oz (75 kg)     PHYSICAL EXAM: VS:  There were no vitals taken for this visit. , BMI There is no height or weight on file to calculate BMI. General:Pleasant affect, NAD Skin:Warm and dry, brisk capillary refill HEENT:normocephalic, sclera clear, mucus membranes moist Neck:supple, no JVD, no bruits  Heart:S1S2 RRR without murmur, gallup, rub or click Lungs:clear without rales, rhonchi, or wheezes KDT:OIZT, non tender, + BS, do not palpate liver spleen or masses Ext:no lower ext edema, 2+ pedal pulses, 2+ radial pulses Neuro:alert and oriented, MAE, follows commands, + facial symmetry    EKG:  EKG is ordered today.  The ekg ordered today demonstrates ***   Recent Labs: 12/31/2017: B Natriuretic Peptide 928.0; TSH 1.178 01/03/2018: Magnesium 2.2 01/08/2018: ALT 21; BUN 16; Creatinine, Ser 1.22; Hemoglobin 14.3; Platelets 268.0; Potassium 4.1; Sodium 140    Lipid Panel    Component Value Date/Time   CHOL 180 01/02/2018 0452   TRIG 48 01/02/2018 0452   HDL 57 01/02/2018 0452   CHOLHDL 3.2 01/02/2018 0452   VLDL 10 01/02/2018 0452   LDLCALC 113 (H) 01/02/2018 0452       Other studies Reviewed: Additional studies/ records that were reviewed today  include: ***. Echocardiogram 5/272019 Left ventricle: The cavity size was mildly dilated. Wall thickness was increased in a pattern of mild LVH. Systolic function was severely reduced. The estimated ejection fraction was 15%. Diffuse hypokinesis. Doppler parameters are consistent with high ventricular filling pressure. - Aortic valve: There was trivial regurgitation. - Aortic root: The aortic root was mildly dilated. - Mitral valve: There was mild regurgitation. - Left atrium: The atrium was moderately dilated. - Pericardium, extracardiac: A small pericardial effusion was identified.  Impressions:  - Severe global reduction in LV systolic function; elevated LV filling pressure; mild LVH and LVE; trace AI; mildly dilated aortic root; mild MR; moderate LAE; small pericardial effusion with mild RA collapse.  ASSESSMENT AND PLAN:  1.  ***   Current medicines are reviewed with the patient today.  The patient Has no concerns regarding medicines.  The following changes have been made:  See above Labs/ tests ordered today include:see above  Disposition:   FU:  see above  Signed, Cecilie Kicks, NP  04/11/2018 10:40 AM    [place], [place], [place], [place] Phone: [phone]; Fax: [phone]

## 2018-04-12 ENCOUNTER — Ambulatory Visit: Payer: Medicare Other | Admitting: Cardiology

## 2018-04-30 ENCOUNTER — Inpatient Hospital Stay (HOSPITAL_BASED_OUTPATIENT_CLINIC_OR_DEPARTMENT_OTHER)
Admission: EM | Admit: 2018-04-30 | Discharge: 2018-05-08 | DRG: 291 | Disposition: A | Discharge status: Home / Self Care | Payer: Medicare Other | Attending: Internal Medicine | Admitting: Internal Medicine

## 2018-04-30 ENCOUNTER — Encounter (HOSPITAL_BASED_OUTPATIENT_CLINIC_OR_DEPARTMENT_OTHER): Payer: Self-pay | Admitting: *Deleted

## 2018-04-30 ENCOUNTER — Other Ambulatory Visit: Payer: Self-pay

## 2018-04-30 ENCOUNTER — Emergency Department (HOSPITAL_BASED_OUTPATIENT_CLINIC_OR_DEPARTMENT_OTHER): Payer: Medicare Other

## 2018-04-30 DIAGNOSIS — Z8673 Personal history of transient ischemic attack (TIA), and cerebral infarction without residual deficits: Secondary | ICD-10-CM

## 2018-04-30 DIAGNOSIS — I5023 Acute on chronic systolic (congestive) heart failure: Secondary | ICD-10-CM | POA: Diagnosis present

## 2018-04-30 DIAGNOSIS — I10 Essential (primary) hypertension: Secondary | ICD-10-CM | POA: Diagnosis present

## 2018-04-30 DIAGNOSIS — Z888 Allergy status to other drugs, medicaments and biological substances status: Secondary | ICD-10-CM | POA: Diagnosis not present

## 2018-04-30 DIAGNOSIS — I5043 Acute on chronic combined systolic (congestive) and diastolic (congestive) heart failure: Secondary | ICD-10-CM | POA: Diagnosis present

## 2018-04-30 DIAGNOSIS — Z87891 Personal history of nicotine dependence: Secondary | ICD-10-CM | POA: Diagnosis not present

## 2018-04-30 DIAGNOSIS — H409 Unspecified glaucoma: Secondary | ICD-10-CM | POA: Diagnosis present

## 2018-04-30 DIAGNOSIS — R0602 Shortness of breath: Secondary | ICD-10-CM

## 2018-04-30 DIAGNOSIS — E876 Hypokalemia: Secondary | ICD-10-CM | POA: Diagnosis present

## 2018-04-30 DIAGNOSIS — Z9114 Patient's other noncompliance with medication regimen: Secondary | ICD-10-CM

## 2018-04-30 DIAGNOSIS — M549 Dorsalgia, unspecified: Secondary | ICD-10-CM | POA: Diagnosis present

## 2018-04-30 DIAGNOSIS — I272 Pulmonary hypertension, unspecified: Secondary | ICD-10-CM | POA: Diagnosis present

## 2018-04-30 DIAGNOSIS — Z9981 Dependence on supplemental oxygen: Secondary | ICD-10-CM | POA: Diagnosis not present

## 2018-04-30 DIAGNOSIS — F1410 Cocaine abuse, uncomplicated: Secondary | ICD-10-CM | POA: Diagnosis present

## 2018-04-30 DIAGNOSIS — J9611 Chronic respiratory failure with hypoxia: Secondary | ICD-10-CM | POA: Diagnosis present

## 2018-04-30 DIAGNOSIS — I130 Hypertensive heart and chronic kidney disease with heart failure and stage 1 through stage 4 chronic kidney disease, or unspecified chronic kidney disease: Secondary | ICD-10-CM | POA: Diagnosis present

## 2018-04-30 DIAGNOSIS — N400 Enlarged prostate without lower urinary tract symptoms: Secondary | ICD-10-CM | POA: Diagnosis present

## 2018-04-30 DIAGNOSIS — E785 Hyperlipidemia, unspecified: Secondary | ICD-10-CM | POA: Diagnosis present

## 2018-04-30 DIAGNOSIS — I160 Hypertensive urgency: Secondary | ICD-10-CM | POA: Diagnosis present

## 2018-04-30 DIAGNOSIS — N183 Chronic kidney disease, stage 3 unspecified: Secondary | ICD-10-CM

## 2018-04-30 DIAGNOSIS — R7989 Other specified abnormal findings of blood chemistry: Secondary | ICD-10-CM

## 2018-04-30 DIAGNOSIS — N179 Acute kidney failure, unspecified: Secondary | ICD-10-CM | POA: Diagnosis present

## 2018-04-30 DIAGNOSIS — I428 Other cardiomyopathies: Secondary | ICD-10-CM | POA: Diagnosis present

## 2018-04-30 DIAGNOSIS — I313 Pericardial effusion (noninflammatory): Secondary | ICD-10-CM | POA: Diagnosis present

## 2018-04-30 DIAGNOSIS — Z8711 Personal history of peptic ulcer disease: Secondary | ICD-10-CM | POA: Diagnosis not present

## 2018-04-30 DIAGNOSIS — I5030 Unspecified diastolic (congestive) heart failure: Secondary | ICD-10-CM | POA: Diagnosis not present

## 2018-04-30 DIAGNOSIS — R0600 Dyspnea, unspecified: Secondary | ICD-10-CM

## 2018-04-30 DIAGNOSIS — Z7982 Long term (current) use of aspirin: Secondary | ICD-10-CM

## 2018-04-30 DIAGNOSIS — Z79899 Other long term (current) drug therapy: Secondary | ICD-10-CM | POA: Diagnosis not present

## 2018-04-30 DIAGNOSIS — Z9119 Patient's noncompliance with other medical treatment and regimen: Secondary | ICD-10-CM | POA: Diagnosis not present

## 2018-04-30 DIAGNOSIS — R0601 Orthopnea: Secondary | ICD-10-CM | POA: Diagnosis not present

## 2018-04-30 DIAGNOSIS — Z880 Allergy status to penicillin: Secondary | ICD-10-CM

## 2018-04-30 DIAGNOSIS — R079 Chest pain, unspecified: Secondary | ICD-10-CM

## 2018-04-30 HISTORY — DX: Dyspnea, unspecified: R06.00

## 2018-04-30 LAB — COMPREHENSIVE METABOLIC PANEL
ALBUMIN: 3 g/dL — AB (ref 3.5–5.0)
ALT: 17 U/L (ref 0–44)
AST: 20 U/L (ref 15–41)
Alkaline Phosphatase: 53 U/L (ref 38–126)
Anion gap: 7 (ref 5–15)
BILIRUBIN TOTAL: 0.5 mg/dL (ref 0.3–1.2)
BUN: 23 mg/dL — AB (ref 6–20)
CHLORIDE: 111 mmol/L (ref 98–111)
CO2: 25 mmol/L (ref 22–32)
Calcium: 8.2 mg/dL — ABNORMAL LOW (ref 8.9–10.3)
Creatinine, Ser: 1.44 mg/dL — ABNORMAL HIGH (ref 0.61–1.24)
GFR calc Af Amer: 60 mL/min — ABNORMAL LOW (ref >60)
GFR calc non Af Amer: 51 mL/min — ABNORMAL LOW (ref >60)
GLUCOSE: 117 mg/dL — AB (ref 70–99)
Potassium: 3.8 mmol/L (ref 3.5–5.1)
SODIUM: 143 mmol/L (ref 135–145)
Total Protein: 6.1 g/dL — ABNORMAL LOW (ref 6.5–8.1)

## 2018-04-30 LAB — RAPID URINE DRUG SCREEN, HOSP PERFORMED
AMPHETAMINES: NOT DETECTED
BARBITURATES: NOT DETECTED
BENZODIAZEPINES: NOT DETECTED
Cocaine: POSITIVE — AB
Opiates: NOT DETECTED
TETRAHYDROCANNABINOL: NOT DETECTED

## 2018-04-30 LAB — CBC
HEMATOCRIT: 40.2 % (ref 39.0–52.0)
Hemoglobin: 13.5 g/dL (ref 13.0–17.0)
MCH: 26.6 pg (ref 26.0–34.0)
MCHC: 33.6 g/dL (ref 30.0–36.0)
MCV: 79.3 fL (ref 78.0–100.0)
PLATELETS: 229 10*3/uL (ref 150–400)
RBC: 5.07 MIL/uL (ref 4.22–5.81)
RDW: 16.1 % — ABNORMAL HIGH (ref 11.5–15.5)
WBC: 6.4 10*3/uL (ref 4.0–10.5)

## 2018-04-30 LAB — URINALYSIS, ROUTINE W REFLEX MICROSCOPIC
Bilirubin Urine: NEGATIVE
GLUCOSE, UA: NEGATIVE mg/dL
HGB URINE DIPSTICK: NEGATIVE
Ketones, ur: NEGATIVE mg/dL
Leukocytes, UA: NEGATIVE
Nitrite: NEGATIVE
Protein, ur: NEGATIVE mg/dL
SPECIFIC GRAVITY, URINE: 1.015 (ref 1.005–1.030)
pH: 7.5 (ref 5.0–8.0)

## 2018-04-30 LAB — BRAIN NATRIURETIC PEPTIDE: B Natriuretic Peptide: 448.5 pg/mL — ABNORMAL HIGH (ref 0.0–100.0)

## 2018-04-30 LAB — TROPONIN I
TROPONIN I: 0.03 ng/mL — AB (ref <0.03)
Troponin I: <0.03 ng/mL (ref <0.03)

## 2018-04-30 MED ORDER — ACETAMINOPHEN 325 MG PO TABS
650.0000 mg | ORAL_TABLET | ORAL | Status: DC | PRN
  Administered 2018-05-01 – 2018-05-07 (×5): 650 mg via ORAL
  Filled 2018-04-30 (×5): qty 2

## 2018-04-30 MED ORDER — SODIUM CHLORIDE 0.9 % IV SOLN: INTRAVENOUS | Status: DC

## 2018-04-30 MED ORDER — ISOSORBIDE MONONITRATE ER 30 MG PO TB24
30.0000 mg | ORAL_TABLET | Freq: Every day | ORAL | Status: DC
  Administered 2018-05-01 – 2018-05-08 (×8): 30 mg via ORAL
  Filled 2018-04-30 (×8): qty 1

## 2018-04-30 MED ORDER — TAMSULOSIN HCL 0.4 MG PO CAPS
0.4000 mg | ORAL_CAPSULE | Freq: Every day | ORAL | Status: DC
  Administered 2018-05-01 – 2018-05-07 (×7): 0.4 mg via ORAL
  Filled 2018-04-30 (×7): qty 1

## 2018-04-30 MED ORDER — FUROSEMIDE 10 MG/ML IJ SOLN
40.0000 mg | Freq: Two times a day (BID) | INTRAMUSCULAR | Status: DC
  Administered 2018-05-01 – 2018-05-04 (×7): 40 mg via INTRAVENOUS
  Filled 2018-04-30 (×7): qty 4

## 2018-04-30 MED ORDER — PANTOPRAZOLE SODIUM 40 MG PO TBEC
40.0000 mg | DELAYED_RELEASE_TABLET | Freq: Two times a day (BID) | ORAL | Status: DC
  Administered 2018-05-01 – 2018-05-08 (×15): 40 mg via ORAL
  Filled 2018-04-30 (×15): qty 1

## 2018-04-30 MED ORDER — ENOXAPARIN SODIUM 40 MG/0.4ML ~~LOC~~ SOLN
40.0000 mg | SUBCUTANEOUS | Status: DC
  Administered 2018-04-30: 40 mg via SUBCUTANEOUS
  Filled 2018-04-30 (×5): qty 0.4

## 2018-04-30 MED ORDER — SODIUM CHLORIDE 0.9 % IV SOLN: 250.0000 mL | INTRAVENOUS | Status: DC | PRN

## 2018-04-30 MED ORDER — ONDANSETRON HCL 4 MG/2ML IJ SOLN: 4.0000 mg | Freq: Four times a day (QID) | INTRAMUSCULAR | Status: DC | PRN

## 2018-04-30 MED ORDER — FUROSEMIDE 10 MG/ML IJ SOLN
40.0000 mg | Freq: Once | INTRAMUSCULAR | Status: AC
  Administered 2018-04-30: 40 mg via INTRAVENOUS
  Filled 2018-04-30: qty 4

## 2018-04-30 MED ORDER — HYDRALAZINE HCL 25 MG PO TABS
25.0000 mg | ORAL_TABLET | Freq: Three times a day (TID) | ORAL | Status: DC
  Administered 2018-04-30 – 2018-05-08 (×23): 25 mg via ORAL
  Filled 2018-04-30 (×23): qty 1

## 2018-04-30 MED ORDER — SODIUM CHLORIDE 0.9% FLUSH: 3.0000 mL | INTRAVENOUS | Status: DC | PRN

## 2018-04-30 MED ORDER — SODIUM CHLORIDE 0.9% FLUSH
3.0000 mL | Freq: Two times a day (BID) | INTRAVENOUS | Status: DC
  Administered 2018-04-30 – 2018-05-08 (×15): 3 mL via INTRAVENOUS

## 2018-04-30 NOTE — ED Notes (Signed)
Unable to communicate with Pt. Due to he mumbles when speaking due to he states he is too sleepy to talk.

## 2018-04-30 NOTE — ED Notes (Signed)
Pt. Asleep each time RN in to check on pt.  Pt. Awakened by RN and asked for urine sample.  Pt. Said he needs to sit on side of bed.  RN asked EMT (male EMT) to assist the Pt. With task.   Pt. Very drowsy at present time.  NO reports of pain.

## 2018-04-30 NOTE — Progress Notes (Signed)
62 y/o male with CHF EF 15% presented to [facility] with CHF exacerbation , afebrile, BNP 450 , creatinine stable at 1.4 , trp -ve , drug screen positive for cocaine. Hemodynamically stable , saturating well on 2lits (home o2 at baseline) and BP 119/90. Received IV lasix 40mg  in ED and diuresing well. Accepted in transfer for inpatient telemetry bed at [facility].

## 2018-04-30 NOTE — H&P (Signed)
History and Physical    Sebastian Lurz BSJ:[id number] DOB: 08-23-1955 DOA: 04/30/2018  PCP: Luetta Nutting, DO   Patient coming from: Home, by way of [facility]   Chief Complaint: SOB, DOE, orthopnea   HPI: Rolen Conger is a 62 y.o. male with medical history significant for chronic kidney disease stage III, chronic hypoxic respiratory failure, hypertension, chronic systolic CHF, and cocaine abuse, who presents to the emergency department for evaluation of shortness of breath, orthopnea, and chest tightness.  Patient reports that he has not taken his medications for approximately a month.  Over the past few days, he has noted worsening exertional dyspnea and intermittent chest tightness.  He has also developed orthopnea which is his main complaint.  Denies fevers, chills, or productive cough.  Has noted mild new lower extremity edema.  [facility] ED Course: Upon arrival to the ED, patient is found to be afebrile, saturating adequately on his usual 2 L/min supplemental oxygen, hypertensive with diastolic pressures in the 110 range.  Chest x-ray without frank edema.  Chemistry panel with creatinine of 1.44, similar to priors.  CBC unremarkable.  BNP elevated 449 and troponin slightly elevated at 0.03.  Urinalysis is unremarkable and UDS positive for cocaine.  Patient was given 40 mg IV Lasix in the ED and has begun to diurese well.  Admission to [facility] was arranged for ongoing evaluation and management of acute on chronic systolic CHF in the setting of medication noncompliance and ongoing cocaine abuse.  Review of Systems:  All other systems reviewed and apart from HPI, are negative.  Past Medical History:  Diagnosis Date  . CHF (congestive heart failure) ([place])   . Chronic back pain   . Gastric ulcer   . Hypertension   . Prostate enlargement   . Stroke [facility])     Past Surgical History:  Procedure Laterality Date  . BACK SURGERY       reports that he quit smoking  about 3 months ago. He has never used smokeless tobacco. He reports that he has current or past drug history. He reports that he does not drink alcohol.  Allergies  Allergen Reactions  . Lisinopril Swelling  . Iodinated Diagnostic Agents Nausea And Vomiting  . Penicillins Other (See Comments)    Has patient had a PCN reaction causing immediate rash, facial/tongue/throat swelling, SOB or lightheadedness with hypotension: no Has patient had a PCN reaction causing severe rash involving mucus membranes or skin necrosis: no Has patient had a PCN reaction that required hospitalization: no Has patient had a PCN reaction occurring within the last 10 years: No If all of the above answers are "NO", then may proceed with Cephalosporin use. "pt states does nothing for him"    History reviewed. No pertinent family history.   Prior to Admission medications   Medication Sig Start Date End Date Taking? Authorizing Provider  carvedilol (COREG) 3.125 MG tablet Take 1 tablet (3.125 mg total) by mouth 2 (two) times daily. 01/03/18 01/03/19  Debbe Odea, MD  furosemide (LASIX) 40 MG tablet Take 1 tablet (40 mg total) by mouth daily. 01/03/18 01/03/19  Debbe Odea, MD  hydrALAZINE (APRESOLINE) 25 MG tablet Take 1 tablet (25 mg total) by mouth 3 (three) times daily. 01/03/18   Debbe Odea, MD  isosorbide mononitrate (IMDUR) 30 MG 24 hr tablet Take 1 tablet (30 mg total) by mouth daily. 01/04/18   Debbe Odea, MD  omeprazole (PRILOSEC) 40 MG capsule Take 1 capsule (40 mg total) by  mouth 2 (two) times daily. 01/08/18   Luetta Nutting, DO  spironolactone (ALDACTONE) 25 MG tablet Take 1 tablet (25 mg total) by mouth daily. 01/03/18 01/03/19  Debbe Odea, MD  tamsulosin (FLOMAX) 0.4 MG CAPS capsule Take 1 capsule (0.4 mg total) by mouth daily after supper. 01/03/18   Debbe Odea, MD    Physical Exam: Vitals:   04/30/18 1234 04/30/18 1430 04/30/18 1444 04/30/18 1605  BP: (!) 145/101 (!) 133/103 (!) 119/105 (!)  139/111  Pulse: 80 (!) 53 (!) 53 68  Resp: 18 17 14  (!) 21  Temp: 98.1 F (36.7 C)     TempSrc: Oral     SpO2: 98% 100% 100% 98%  Weight:      Height:         Constitutional: NAD, calm  Eyes: PERTLA, lids and conjunctivae normal ENMT: Mucous membranes are moist. Posterior pharynx clear of any exudate or lesions.   Neck: normal, supple, no masses, no thyromegaly Respiratory: Mild dyspnea with speech, no wheezing, no rhonchi. No accessory muscle use.   Cardiovascular: S1 & S2 heard, regular rate and rhythm. Trace pedal edema bilaterally. Neck veins distended throughout their visible course.  Abdomen: No distension, no tenderness, soft. Bowel sounds normal.  Musculoskeletal: no clubbing / cyanosis. No joint deformity upper and lower extremities.  Skin: no significant rashes, lesions, ulcers. Warm, dry, well-perfused. Neurologic: CN 2-12 grossly intact. Sensation intact. Strength 5/5 in all 4 limbs.  Psychiatric: Alert and oriented x 3. Calm, cooperative.    Labs on Admission: I have personally reviewed following labs and imaging studies  CBC: Recent Labs  Lab 04/30/18 1306  WBC 6.4  HGB 13.5  HCT 40.2  MCV 79.3  PLT 322   Basic Metabolic Panel: Recent Labs  Lab 04/30/18 1306  NA 143  K 3.8  CL 111  CO2 25  GLUCOSE 117*  BUN 23*  CREATININE 1.44*  CALCIUM 8.2*   GFR: Estimated Creatinine Clearance: 52.8 mL/min (A) (by C-G formula based on SCr of 1.44 mg/dL (H)). Liver Function Tests: Recent Labs  Lab 04/30/18 1306  AST 20  ALT 17  ALKPHOS 53  BILITOT 0.5  PROT 6.1*  ALBUMIN 3.0*   No results for input(s): LIPASE, AMYLASE in the last 168 hours. No results for input(s): AMMONIA in the last 168 hours. Coagulation Profile: No results for input(s): INR, PROTIME in the last 168 hours. Cardiac Enzymes: Recent Labs  Lab 04/30/18 1306  TROPONINI 0.03*   BNP (last 3 results) No results for input(s): PROBNP in the last 8760 hours. HbA1C: No results for  input(s): HGBA1C in the last 72 hours. CBG: No results for input(s): GLUCAP in the last 168 hours. Lipid Profile: No results for input(s): CHOL, HDL, LDLCALC, TRIG, CHOLHDL, LDLDIRECT in the last 72 hours. Thyroid Function Tests: No results for input(s): TSH, T4TOTAL, FREET4, T3FREE, THYROIDAB in the last 72 hours. Anemia Panel: No results for input(s): VITAMINB12, FOLATE, FERRITIN, TIBC, IRON, RETICCTPCT in the last 72 hours. Urine analysis:    Component Value Date/Time   COLORURINE YELLOW 04/30/2018 [place] 04/30/2018 1415   LABSPEC 1.015 04/30/2018 1415   PHURINE 7.5 04/30/2018 1415   GLUCOSEU NEGATIVE 04/30/2018 1415   HGBUR NEGATIVE 04/30/2018 1415   BILIRUBINUR NEGATIVE 04/30/2018 1415   BILIRUBINUR neg 01/08/2018 1105   KETONESUR NEGATIVE 04/30/2018 1415   PROTEINUR NEGATIVE 04/30/2018 1415   UROBILINOGEN 0.2 01/08/2018 1105   NITRITE NEGATIVE 04/30/2018 1415   LEUKOCYTESUR NEGATIVE 04/30/2018 1415  Sepsis Labs: @LABRCNTIP (procalcitonin:4,lacticidven:4) )No results found for this or any previous visit (from the past 240 hour(s)).   Radiological Exams on Admission: Dg Chest 2 View  Result Date: 04/30/2018 CLINICAL DATA:  Shortness of breath. EXAM: CHEST - 2 VIEW COMPARISON:  Radiographs of May 28th 2019. FINDINGS: The heart size and mediastinal contours are within normal limits. No pneumothorax or pleural effusion is noted. Stable bibasilar subsegmental atelectasis or scarring. The visualized skeletal structures are unremarkable. IMPRESSION: Stable bibasilar subsegmental atelectasis or scarring. Electronically Signed   By: Marijo Conception, M.D.   On: 04/30/2018 12:58    EKG: Ordered, not yet performed.   Assessment/Plan   1. Acute on chronic systolic CHF  - Presents with progressive SOB and orthopnea in setting of recent cocaine use and non-compliance with medications for the last month - Echo in May with EF 15% and diffuse hypokinesis  - Found to  have JVD, trace pedal edema, elevated BNP  - Treated with Lasix 40 mg IV in ED and has been diuresing well  - Continue cardiac monitoring, continue diuresis with Lasix 40 mg IV q12h, SLIV and follow daily wt and I/O's    2. Hypertension with hypertensive urgency  - DBP elevated to 110 range in ED  - Anticipate improvement with diuresis  - Continue hydralazine and Lasix, hold Coreg for now given recent cocaine use    3. CKD stage III  - SCr is 1.44 on admission, similar to priors  - Renally-dose medications and follow daily chem panel during diuresis    4. Cocaine abuse  - UDS positive for cocaine  - Counseled toward cessation    DVT prophylaxis: Lovenox Code Status: Full  Family Communication: Discussed with patient  Consults called: None Admission status: Inpatient     Vianne Bulls, MD [facility] Pager [phone]  If 7PM-7AM, please contact night-coverage [email or web address] Password [facility]  04/30/2018, 7:19 PM

## 2018-04-30 NOTE — Discharge Instructions (Signed)
Transfer to [employer] [facility]. [facility]ist on arrival to bed/floor.

## 2018-04-30 NOTE — ED Triage Notes (Signed)
SOB, headache and dizziness since yesterday. Pt is on oxygen 2 L/m [place].

## 2018-04-30 NOTE — Progress Notes (Signed)
Patient arrived from [place] at the shift change.  Paged Admission's to informed of patients arrival. No orders. Placed on cardiac monitoring and verified with CCMD.  Venetia Night, RN

## 2018-04-30 NOTE — ED Provider Notes (Signed)
[facility] EMERGENCY DEPARTMENT Provider Note   CSN: [id number] Arrival date & time: 04/30/18  1229     History   Chief Complaint Chief Complaint  Patient presents with  . Shortness of Breath  . Dizziness    HPI Terrence Garcia is a 62 y.o. male.  Patient c/o sob in past week. Gradual onset, persistent, moderate, slowly worse, increased in past day, worse when lies flat, worse w exertion. Hx chf, states hasnt taken any meds in 1 month. Denies cough or sore throat. No fever or chills. Chest has felt tight for the past few days, intermittent, persistent, not pleuritic, ?worse w exertion.  No leg pain + ankle/lower leg swelling bil. Normal urine output.  The history is provided by the patient and a relative.  Shortness of Breath  Associated symptoms include headaches, cough and chest pain. Pertinent negatives include no fever, no sore throat, no neck pain, no vomiting, no abdominal pain and no rash.  Dizziness  Associated symptoms: chest pain, headaches and shortness of breath   Associated symptoms: no palpitations and no vomiting     Past Medical History:  Diagnosis Date  . CHF (congestive heart failure) ([place])   . Chronic back pain   . Gastric ulcer   . Hypertension   . Prostate enlargement   . Stroke [facility])     Patient Active Problem List   Diagnosis Date Noted  . Impaired gait and mobility 02/16/2018  . Incontinence of feces 01/08/2018  . Non-ischemic cardiomyopathy ([place]) 01/03/2018  . Acute systolic CHF (congestive heart failure) ([place]) 01/03/2018  . Benign essential HTN 01/03/2018  . CKD (chronic kidney disease) stage 3, GFR 30-59 ml/min (H[facility]) 01/03/2018  . BPH (benign prostatic hyperplasia) 12/31/2017  . Abnormal LFTs 12/31/2017  . Cocaine abuse ([place]) 12/31/2017  . HLD (hyperlipidemia) 12/31/2017  . History of CVA (cerebrovascular accident) 12/31/2017  . Palpitations 05/10/2017  . History of glaucoma 04/19/2017  . TIA (transient ischemic attack)  04/19/2017  . IIH (idiopathic intracranial hypertension) 01/18/2016    Past Surgical History:  Procedure Laterality Date  . BACK SURGERY          Home Medications    Prior to Admission medications   Medication Sig Start Date End Date Taking? Authorizing Provider  carvedilol (COREG) 3.125 MG tablet Take 1 tablet (3.125 mg total) by mouth 2 (two) times daily. 01/03/18 01/03/19  Debbe Odea, MD  furosemide (LASIX) 40 MG tablet Take 1 tablet (40 mg total) by mouth daily. 01/03/18 01/03/19  Debbe Odea, MD  hydrALAZINE (APRESOLINE) 25 MG tablet Take 1 tablet (25 mg total) by mouth 3 (three) times daily. 01/03/18   Debbe Odea, MD  isosorbide mononitrate (IMDUR) 30 MG 24 hr tablet Take 1 tablet (30 mg total) by mouth daily. 01/04/18   Debbe Odea, MD  omeprazole (PRILOSEC) 40 MG capsule Take 1 capsule (40 mg total) by mouth 2 (two) times daily. 01/08/18   Luetta Nutting, DO  spironolactone (ALDACTONE) 25 MG tablet Take 1 tablet (25 mg total) by mouth daily. 01/03/18 01/03/19  Debbe Odea, MD  tamsulosin (FLOMAX) 0.4 MG CAPS capsule Take 1 capsule (0.4 mg total) by mouth daily after supper. 01/03/18   Debbe Odea, MD    Family History No family history on file.  Social History Social History   Tobacco Use  . Smoking status: Former Smoker    Last attempt to quit: 01/01/2018    Years since quitting: 0.3  . Smokeless tobacco: Never Used  Substance Use  Topics  . Alcohol use: No  . Drug use: Yes    Comment: states sober for 14 years and used crack two days ago      Allergies   Lisinopril; Iodinated diagnostic agents; and Penicillins   Review of Systems Review of Systems  Constitutional: Negative for fever.  HENT: Negative for sore throat.   Eyes: Negative for redness.  Respiratory: Positive for cough and shortness of breath.   Cardiovascular: Positive for chest pain. Negative for palpitations.  Gastrointestinal: Negative for abdominal pain and vomiting.  Endocrine: Negative  for polyuria.  Genitourinary: Negative for dysuria and flank pain.  Musculoskeletal: Negative for back pain and neck pain.  Skin: Negative for rash.  Neurological: Positive for dizziness and headaches.       Intermittent frontal headaches, gradual onset, similar to prior - no acute, abrupt or severe headaches. Occasionally faint/lightheaded when stands. No room spinning. No imbalance of gait or falls.   Hematological: Does not bruise/bleed easily.  Psychiatric/Behavioral: Negative for confusion.     Physical Exam Updated Vital Signs BP (!) 145/101   Pulse 80   Temp 98.1 F (36.7 C) (Oral)   Resp 18   Ht 1.727 m (5\' 8" )   Wt 72.6 kg   SpO2 98%   BMI 24.33 kg/m   Physical Exam  Constitutional: He is oriented to person, place, and time. He appears well-developed and well-nourished.  Sob appearing.   HENT:  Mouth/Throat: Oropharynx is clear and moist.  Eyes: Pupils are equal, round, and reactive to light. Conjunctivae are normal.  Neck: Neck supple. No tracheal deviation present.  Cardiovascular: Normal rate, regular rhythm, normal heart sounds and intact distal pulses. Exam reveals no gallop and no friction rub.  No murmur heard. Pulmonary/Chest: Effort normal. No accessory muscle usage. No respiratory distress. He has rales.  Abdominal: Soft. Bowel sounds are normal. He exhibits no distension. There is no tenderness.  Genitourinary:  Genitourinary Comments: No cva tenderness  Musculoskeletal:  Mild ankle/lower leg edema bil.   Neurological: He is alert and oriented to person, place, and time.  Speech clear/fluent. Motor/sens grossly intact bil.   Skin: Skin is warm and dry. No rash noted.  Psychiatric: He has a normal mood and affect.  Nursing note and vitals reviewed.     ED Treatments / Results  Labs (all labs ordered are listed, but only abnormal results are displayed) Results for orders placed or performed during the hospital encounter of 04/30/18  CBC  Result  Value Ref Range   WBC 6.4 4.0 - 10.5 K/uL   RBC 5.07 4.22 - 5.81 MIL/uL   Hemoglobin 13.5 13.0 - 17.0 g/dL   HCT 40.2 39.0 - 52.0 %   MCV 79.3 78.0 - 100.0 fL   MCH 26.6 26.0 - 34.0 pg   MCHC 33.6 30.0 - 36.0 g/dL   RDW 16.1 (H) 11.5 - 15.5 %   Platelets 229 150 - 400 K/uL  Comprehensive metabolic panel  Result Value Ref Range   Sodium 143 135 - 145 mmol/L   Potassium 3.8 3.5 - 5.1 mmol/L   Chloride 111 98 - 111 mmol/L   CO2 25 22 - 32 mmol/L   Glucose, Bld 117 (H) 70 - 99 mg/dL   BUN 23 (H) 6 - 20 mg/dL   Creatinine, Ser 1.44 (H) 0.61 - 1.24 mg/dL   Calcium 8.2 (L) 8.9 - 10.3 mg/dL   Total Protein 6.1 (L) 6.5 - 8.1 g/dL   Albumin 3.0 (L) 3.5 -  5.0 g/dL   AST 20 15 - 41 U/L   ALT 17 0 - 44 U/L   Alkaline Phosphatase 53 38 - 126 U/L   Total Bilirubin 0.5 0.3 - 1.2 mg/dL   GFR calc non Af Amer 51 (L) >60 mL/min   GFR calc Af Amer 60 (L) >60 mL/min   Anion gap 7 5 - 15  Brain natriuretic peptide  Result Value Ref Range   B Natriuretic Peptide 448.5 (H) 0.0 - 100.0 pg/mL  Rapid urine drug screen (hospital performed)  Result Value Ref Range   Opiates NONE DETECTED NONE DETECTED   Cocaine POSITIVE (A) NONE DETECTED   Benzodiazepines NONE DETECTED NONE DETECTED   Amphetamines NONE DETECTED NONE DETECTED   Tetrahydrocannabinol NONE DETECTED NONE DETECTED   Barbiturates NONE DETECTED NONE DETECTED  Urinalysis, Routine w reflex microscopic  Result Value Ref Range   Color, Urine YELLOW YELLOW   APPearance CLEAR CLEAR   Specific Gravity, Urine 1.015 1.005 - 1.030   pH 7.5 5.0 - 8.0   Glucose, UA NEGATIVE NEGATIVE mg/dL   Hgb urine dipstick NEGATIVE NEGATIVE   Bilirubin Urine NEGATIVE NEGATIVE   Ketones, ur NEGATIVE NEGATIVE mg/dL   Protein, ur NEGATIVE NEGATIVE mg/dL   Nitrite NEGATIVE NEGATIVE   Leukocytes, UA NEGATIVE NEGATIVE  Troponin I  Result Value Ref Range   Troponin I 0.03 (HH) <0.03 ng/mL   Dg Chest 2 View  Result Date: 04/30/2018 CLINICAL DATA:  Shortness of  breath. EXAM: CHEST - 2 VIEW COMPARISON:  Radiographs of May 28th 2019. FINDINGS: The heart size and mediastinal contours are within normal limits. No pneumothorax or pleural effusion is noted. Stable bibasilar subsegmental atelectasis or scarring. The visualized skeletal structures are unremarkable. IMPRESSION: Stable bibasilar subsegmental atelectasis or scarring. Electronically Signed   By: Marijo Conception, M.D.   On: 04/30/2018 12:58   ED ECG REPORT   Date: 04/30/2018  Rate: 75  Rhythm: normal sinus rhythm  QRS Axis: normal  Intervals: normal  ST/T Wave abnormalities: nonspecific T wave changes  Conduction Disutrbances:none  Narrative Interpretation:   Old EKG Reviewed: changes noted  I have personally reviewed the EKG tracing  Radiology Dg Chest 2 View  Result Date: 04/30/2018 CLINICAL DATA:  Shortness of breath. EXAM: CHEST - 2 VIEW COMPARISON:  Radiographs of May 28th 2019. FINDINGS: The heart size and mediastinal contours are within normal limits. No pneumothorax or pleural effusion is noted. Stable bibasilar subsegmental atelectasis or scarring. The visualized skeletal structures are unremarkable. IMPRESSION: Stable bibasilar subsegmental atelectasis or scarring. Electronically Signed   By: Marijo Conception, M.D.   On: 04/30/2018 12:58    Procedures Procedures (including critical care time)  Medications Ordered in ED Medications  0.9 %  sodium chloride infusion (has no administration in time range)     Initial Impression / Assessment and Plan / ED Course  I have reviewed the triage vital signs and the nursing notes.  Pertinent labs & imaging results that were available during my care of the patient were reviewed by me and considered in my medical decision making (see chart for details).  Iv ns. Continuous pulse ox and monitor. o2 [place]. Labs. Cxr. Ecg.   Reviewed nursing notes and prior charts for additional history.   ecg done but not crossing over - result entered above  - no acute st/t changes from prior.  Labs reviewed - trop .03, bnp elev.  Lasix iv.   cxr reviewed - w vascular congestion.  Given chest pain, chf (prior echo ef 15%), non compliance w meds, significant orthopnea/pnd, dyspnea at rest  - will admit.   Pt requests admission to [facility].  Hospitalists consulted for admission.     Final Clinical Impressions(s) / ED Diagnoses   Final diagnoses:  None    ED Discharge Orders    None       Lajean Saver, MD 04/30/18 1511

## 2018-05-01 ENCOUNTER — Inpatient Hospital Stay (HOSPITAL_COMMUNITY): Payer: Medicare Other

## 2018-05-01 ENCOUNTER — Encounter (HOSPITAL_COMMUNITY): Payer: Self-pay | Admitting: General Practice

## 2018-05-01 DIAGNOSIS — Z9114 Patient's other noncompliance with medication regimen: Secondary | ICD-10-CM

## 2018-05-01 DIAGNOSIS — I5030 Unspecified diastolic (congestive) heart failure: Secondary | ICD-10-CM

## 2018-05-01 LAB — BASIC METABOLIC PANEL
Anion gap: 11 (ref 5–15)
BUN: 21 mg/dL — ABNORMAL HIGH (ref 6–20)
CALCIUM: 8.5 mg/dL — AB (ref 8.9–10.3)
CO2: 25 mmol/L (ref 22–32)
CREATININE: 1.49 mg/dL — AB (ref 0.61–1.24)
Chloride: 108 mmol/L (ref 98–111)
GFR, EST AFRICAN AMERICAN: 57 mL/min — AB (ref >60)
GFR, EST NON AFRICAN AMERICAN: 49 mL/min — AB (ref >60)
Glucose, Bld: 137 mg/dL — ABNORMAL HIGH (ref 70–99)
Potassium: 3.3 mmol/L — ABNORMAL LOW (ref 3.5–5.1)
SODIUM: 144 mmol/L (ref 135–145)

## 2018-05-01 LAB — ECHOCARDIOGRAM COMPLETE
HEIGHTINCHES: 68 in
WEIGHTICAEL: 2689.6 [oz_av]

## 2018-05-01 LAB — TROPONIN I

## 2018-05-01 MED ORDER — POTASSIUM CHLORIDE CRYS ER 20 MEQ PO TBCR
40.0000 meq | EXTENDED_RELEASE_TABLET | Freq: Once | ORAL | Status: AC
  Administered 2018-05-01: 40 meq via ORAL
  Filled 2018-05-01: qty 2

## 2018-05-01 NOTE — Progress Notes (Signed)
Echocardiogram 2D Echocardiogram has been performed.  Terrence Garcia 05/01/2018, 1:59 PM

## 2018-05-01 NOTE — Care Management Note (Signed)
Case Management Note  Patient Details  Name: Terrence Garcia MRN: [id number] Date of Birth: May 31, 1956  Subjective/Objective:    CHF, HTN, CKD                Action/Plan: NCM spoke to pt and states he has oxygen at home. He is attempting to find another place to stay but he does not have the resources. States their are drugs in the home, but he does not use.   Expected Discharge Date:                  Expected Discharge Plan:  Home/Self Care  In-House Referral:  NA  Discharge planning Services  CM Consult  Post Acute Care Choice:  NA Choice offered to:  NA  DME Arranged:  N/A DME Agency:  NA  HH Arranged:  NA HH Agency:  NA  Status of Service:  In process, will continue to follow  If discussed at Long Length of Stay Meetings, dates discussed:    Additional Comments:  Erenest Rasher, RN 05/01/2018, 5:08 PM

## 2018-05-01 NOTE — Progress Notes (Signed)
PROGRESS NOTE   Terrence Garcia  GBT:[id number]    DOB: Nov 26, 1955    DOA: 04/30/2018  PCP: Luetta Nutting, DO   I have briefly reviewed patients previous medical records in [facility].  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 had shown EF 32%, presented to ED with few days history of progressively worsening dyspnea, intermittent chest tightness, orthopnea, mild leg edema in the context of noncompliance with any of his prescription medications for approximately 2 months.  Admitted for management of acute on chronic systolic CHF in the context of medication noncompliance and?  Ongoing cocaine abuse (patient denies).   Assessment & Plan:   Principal Problem:   Acute on chronic systolic CHF (congestive heart failure) (HCC) Active Problems:   Cocaine abuse (HCC)   Benign essential HTN   CKD (chronic kidney disease) stage 3, GFR 30-59 ml/min (HCC)   Chronic respiratory failure with hypoxia (HCC)   Hypertensive urgency   Acute on chronic systolic CHF/nonischemic cardiomyopathy: TTE 01/01/2018: LVEF 15%, diffuse hypokinesis.  Patient has not taken any of his prescription medications for approximately 2 months (unclear etiology- apparently had no refills and did not go back to see MD).  Denies smoking cigarettes or cocaine use and states that he is however exposed to both due to his spouse doing it extensively at home.  Presented with progressive  dyspnea, orthopnea and leg edema.  On admission, trace pedal edema, JVD and BNP of 448.5.  Treated with IV Lasix 40 mg every 12 hours.  Resumed hydralazine 25 mg 3 times daily and Imdur 30 mg daily.  Put out 700 mL urine overnight.  Strict intake output.  Repeat TTE to reassess EF.  Improving.  Continue current management.  Essential hypertension with hypertensive urgency: DBP elevated to 110 range in ED.  Secondary to noncompliance and?  Recent cocaine use.  Beta-blockers held due to UDS positive for cocaine.  Started on hydralazine, Imdur and IV Lasix.  Blood pressures better.  Add PRN IV hydralazine.  No ACEI/ARB due to renal insufficiency.  Stage III chronic kidney disease: Baseline creatinine not clearly known, in May was 1.6, in June 1.22, presented with 1.44 and stable.  Follow closely while on IV diuretics.  Hyperlipidemia: Resume atorvastatin 20 mg daily.  Chronic respiratory failure with hypoxia: Unclear etiology.?  Chronic CHF.  Continue oxygen supplementation and titrate as needed.  History of TIA/CVA: Resume aspirin and statins.  History of polysubstance abuse (tobacco and cocaine): UDS positive for cocaine.  Patient states that he has quit and does not do this anymore but exposed to smoke from spouse abusing these at home.  Medical noncompliance: Counseled extensively regarding the importance of compliance with all aspects of medical care.  He verbalizes understanding.  Hypokalemia: Replace and follow.  DVT prophylaxis: Lovenox Code Status: Full Family Communication: None at bedside Disposition: DC home pending clinical improvement   Consultants:  None  Procedures:  None  Antimicrobials:  None   Subjective: Feels better.  Dyspnea and orthopnea improved on IV Lasix.  No chest pain or chest tightness reported.  ROS: As above, otherwise negative.  Objective:  Vitals:   04/30/18 1942 04/30/18 2155 05/01/18 0029 05/01/18 0433  BP: (!) 137/110 (!) 127/92 (!) 137/99  (!) 139/97  Pulse: 76 60 64 66  Resp: 18  18 18   Temp: 98 F (36.7 C)  98.2 F (36.8 C) 97.9 F (36.6 C)  TempSrc: Oral  Oral Oral  SpO2: 100%  98% 100%  Weight: 75.3 kg   76.2 kg  Height: 5\' 8"  (1.727 m)       Examination:  General exam: Pleasant middle-aged male, moderately built and nourished lying comfortably propped up in bed. Respiratory system: Few fine basal crackles but otherwise clear to auscultation. Respiratory effort normal. Cardiovascular system: S1 & S2 heard, RRR. No JVD, murmurs, rubs, gallops or clicks.  Trace bilateral ankle edema.  Telemetry personally reviewed: Sinus rhythm. Gastrointestinal system: Abdomen is nondistended, soft and nontender. No organomegaly or masses felt. Normal bowel sounds heard. Central nervous system: Alert and oriented. No focal neurological deficits. Extremities: Symmetric 5 x 5 power. Skin: No rashes, lesions or ulcers Psychiatry: Judgement and insight appear normal. Mood & affect appropriate.     Data Reviewed: I have personally reviewed following labs and imaging studies  CBC: Recent Labs  Lab 04/30/18 1306  WBC 6.4  HGB 13.5  HCT 40.2  MCV 79.3  PLT 315   Basic Metabolic Panel: Recent Labs  Lab 04/30/18 1306 05/01/18 0223  NA 143 144  K 3.8 3.3*  CL 111 108  CO2 25 25  GLUCOSE 117* 137*  BUN 23* 21*  CREATININE 1.44* 1.49*  CALCIUM 8.2* 8.5*   Liver Function Tests: Recent Labs  Lab 04/30/18 1306  AST 20  ALT 17  ALKPHOS 53  BILITOT 0.5  PROT 6.1*  ALBUMIN 3.0*   Cardiac Enzymes: Recent Labs  Lab 04/30/18 1306 04/30/18 2007 05/01/18 0223  TROPONINI 0.03* <0.03 <0.03     Radiology Studies: Dg Chest 2 View  Result Date: 04/30/2018 CLINICAL DATA:  Shortness of breath. EXAM: CHEST - 2 VIEW COMPARISON:  Radiographs of May 28th 2019. FINDINGS: The heart size and mediastinal contours are within normal limits. No pneumothorax or pleural effusion is noted. Stable bibasilar subsegmental atelectasis or  scarring. The visualized skeletal structures are unremarkable. IMPRESSION: Stable bibasilar subsegmental atelectasis or scarring. Electronically Signed   By: Marijo Conception, M.D.   On: 04/30/2018 12:58        Scheduled Meds: . enoxaparin (LOVENOX) injection  40 mg Subcutaneous Q24H  . furosemide  40 mg Intravenous Q12H  . hydrALAZINE  25 mg Oral TID  . isosorbide mononitrate  30 mg Oral Daily  . pantoprazole  40 mg Oral BID AC  . sodium chloride flush  3 mL Intravenous Q12H  . tamsulosin  0.4 mg Oral QPC supper   Continuous Infusions: . sodium chloride       LOS: 1 day     Vernell Leep, MD, FACP, [facility]. [facility] Pager [phone]  If 7PM-7AM, please contact night-coverage [email or web address] Password [facility] 05/01/2018, 11:25 AM

## 2018-05-02 LAB — BASIC METABOLIC PANEL
Anion gap: 10 (ref 5–15)
BUN: 22 mg/dL — AB (ref 6–20)
CO2: 26 mmol/L (ref 22–32)
CREATININE: 1.42 mg/dL — AB (ref 0.61–1.24)
Calcium: 8.8 mg/dL — ABNORMAL LOW (ref 8.9–10.3)
Chloride: 104 mmol/L (ref 98–111)
GFR calc Af Amer: >60 mL/min (ref >60)
GFR calc non Af Amer: 52 mL/min — ABNORMAL LOW (ref >60)
GLUCOSE: 114 mg/dL — AB (ref 70–99)
Potassium: 3.6 mmol/L (ref 3.5–5.1)
Sodium: 140 mmol/L (ref 135–145)

## 2018-05-02 NOTE — Clinical Social Work Note (Signed)
Clinical Social Work Assessment  Patient Details  Name: Terrence Garcia MRN: [id number] Date of Birth: 03-Apr-1956  Date of referral:  05/02/18               Reason for consult:  Housing Concerns/Homelessness, Substance Use/ETOH Abuse                Permission sought to share information with:    Permission granted to share information::     Name::        Agency::     Relationship::     Contact Information:     Housing/Transportation Living arrangements for the past 2 months:  Single Family Home Source of Information:  Patient Patient Interpreter Needed:  None Criminal Activity/Legal Involvement Pertinent to Current Situation/Hospitalization:  No - Comment as needed Significant Relationships:  Spouse, Adult Children Lives with:  Spouse Do you feel safe going back to the place where you live?  No Need for family participation in patient care:  No (Coment)  Care giving concerns: Patient from home with his spouse. History of substance use. Patient in need of housing resources.   Social Worker assessment / plan: CSW met with patient at bedside and assessed housing situation. Patient alert and oriented. Patient reported he lives with his wife, and he stopped his cocaine about three months ago due to his heart problems. Patient acknowledged he tested positive for cocaine on admission, and stated his wife continues to use cocaine in the home.   Patient indicated he wants to move out to be away from the environment where his wife is using. CSW provided list of housing resources, including [employer].   CSW signing off, as no additional needs identified at this time.  Employment status:  Retired Forensic scientist:  Medicare PT Recommendations:  Not assessed at this time [place] / Referral to community resources:  Other (Comment Required)(housing resources)  Patient/Family's Response to care: Patient appreciative of resources.  Patient/Family's Understanding of  and Emotional Response to Diagnosis, Current Treatment, and Prognosis: Patient with good understanding of his conditions and wants to be out of the home environment where substances are used.  Emotional Assessment Appearance:  Appears stated age Attitude/Demeanor/Rapport:  Engaged Affect (typically observed):  Accepting, Calm, Appropriate Orientation:  Oriented to Self, Oriented to Place, Oriented to  Time, Oriented to Situation Alcohol / Substance use:  Illicit Drugs Psych involvement (Current and /or in the community):  No (Comment)  Discharge Needs  Concerns to be addressed:  Home Safety Concerns Readmission within the last 30 days:  No Current discharge risk:  Substance Abuse Barriers to Discharge:  Continued Medical Work up   Estanislado Emms, LCSW 05/02/2018, 4:24 PM

## 2018-05-02 NOTE — Progress Notes (Signed)
PROGRESS NOTE    Terrence Garcia  IWL:[id number] DOB: 05-27-1956 DOA: 04/30/2018 PCP: Luetta Nutting, DO    Brief Narrative: 62 year old married male, reportedly on disability, lives with spouse, ambulates with the help of a cane, PMH of HTN, HLD, stage III chronic kidney disease, chronic hypoxic respiratory failure on home oxygen 2 L/min, TIA/CVA, BPH, idiopathic intracranial hypertension, glaucoma, polysubstance abuse (tobacco and crack cocaine), possible hepatic hemangiomas, hospitalized 1/74/0814-4/81/8563 for acute systolic CHF, new low EF 14% (suspected nonischemic cardiomyopathy) at which time cardiology consulted, recommended no inpatient ischemic work-up, nuclear stress test in [facility] had shown EF 32%, presented to ED with few days history of progressively worsening dyspnea, intermittent chest tightness, orthopnea, mild leg edema in the context of noncompliance with any of his prescription medications for approximately 2 months.  Admitted for management of acute on chronic systolic CHF in the context of medication noncompliance and?  Ongoing cocaine abuse (patient denies).    Assessment & Plan:   Principal Problem:   Acute on chronic systolic CHF (congestive heart failure) (HCC) Active Problems:   Cocaine abuse (HCC)   Benign essential HTN   CKD (chronic kidney disease) stage 3, GFR 30-59 ml/min (HCC)   Chronic respiratory failure with hypoxia (HCC)   Hypertensive urgency   Acute on chronic systolic CHF/nonischemic cardiomyopathy ECHO  Continue with IV lasix.  Weight; 168--166. Allergy to lisinopril. No ARB die to AKI.  Counseling provided.   HTN;  Imdur, lasix.  No ACEI/ARB due to renal insufficiency.  Stage III chronic kidney disease May was 1.6, in June 1.22, presented with 1.44 and stable.  Monitor on lasxi.   History of TIA/CVA: Resume aspirin and statins.  History of polysubstance abuse (tobacco and cocaine): UDS  Counseling provided.   Chronic  respiratory failure with hypoxia.  ? Related to HF.  On oxygen supplementation.     DVT prophylaxis: Lovenox Code Status: full code.  Family Communication none Disposition Plan: remain inpatient for IV lasix.   Consultants:   none   Procedures:  ECHO; - Left ventricle: The cavity size was severely dilated. There was   mild concentric hypertrophy. Systolic function was severely   reduced. The estimated ejection fraction was in the range of 20%   to 25%. Severe diffuse hypokinesis. There was an increased   relative contribution of atrial contraction to ventricular   filling. Doppler parameters are consistent with abnormal left   ventricular relaxation (grade 1 diastolic dysfunction). - Aortic valve: Trileaflet; mildly thickened, mildly calcified   leaflets. There was trivial regurgitation. - Mitral valve: There was mild regurgitation. - Left atrium: The atrium was moderately dilated.  - Pulmonary arteries: PA peak pressure: 37 mm Hg (S).   Antimicrobials:   none   Subjective: Still complaining of SOB  Objective: Vitals:   05/01/18 2134 05/02/18 0019 05/02/18 0443 05/02/18 0516  BP: (!) 128/95 127/85 (!) 141/107 132/90  Pulse: 81 63 63 (!) 55  Resp:  18 18   Temp:  97.9 F (36.6 C) 98.1 F (36.7 C)   TempSrc:  Oral Oral   SpO2:  97% 100%   Weight:   75.5 kg   Height:        Intake/Output Summary (Last 24 hours) at 05/02/2018 0826 Last data filed at 05/01/2018 2151 Gross per 24 hour  Intake 1233 ml  Output 200 ml  Net 1033 ml   Filed Weights   04/30/18 1942 05/01/18 0433 05/02/18 0443  Weight: 75.3 kg 76.2 kg 75.5 kg  Examination:  General exam: Appears calm and comfortable  Respiratory system: bilateral crackles.  Cardiovascular system: S1 & S2 heard,  Gastrointestinal system: Abdomen is nondistended, soft and nontender. No organomegaly or masses felt. Normal bowel sounds heard. Central nervous system: Alert and oriented. No focal neurological  deficits. Extremities: Symmetric 5 x 5 power. Skin: No rashes, lesions or ulcers Psychiatry: Judgement and insight appear normal. Mood & affect appropriate.     Data Reviewed: I have personally reviewed following labs and imaging studies  CBC: Recent Labs  Lab 04/30/18 1306  WBC 6.4  HGB 13.5  HCT 40.2  MCV 79.3  PLT 400   Basic Metabolic Panel: Recent Labs  Lab 04/30/18 1306 05/01/18 0223 05/02/18 0451  NA 143 144 140  K 3.8 3.3* 3.6  CL 111 108 104  CO2 25 25 26   GLUCOSE 117* 137* 114*  BUN 23* 21* 22*  CREATININE 1.44* 1.49* 1.42*  CALCIUM 8.2* 8.5* 8.8*   GFR: Estimated Creatinine Clearance: 53.5 mL/min (A) (by C-G formula based on SCr of 1.42 mg/dL (H)). Liver Function Tests: Recent Labs  Lab 04/30/18 1306  AST 20  ALT 17  ALKPHOS 53  BILITOT 0.5  PROT 6.1*  ALBUMIN 3.0*   No results for input(s): LIPASE, AMYLASE in the last 168 hours. No results for input(s): AMMONIA in the last 168 hours. Coagulation Profile: No results for input(s): INR, PROTIME in the last 168 hours. Cardiac Enzymes: Recent Labs  Lab 04/30/18 1306 04/30/18 2007 05/01/18 0223  TROPONINI 0.03* <0.03 <0.03   BNP (last 3 results) No results for input(s): PROBNP in the last 8760 hours. HbA1C: No results for input(s): HGBA1C in the last 72 hours. CBG: No results for input(s): GLUCAP in the last 168 hours. Lipid Profile: No results for input(s): CHOL, HDL, LDLCALC, TRIG, CHOLHDL, LDLDIRECT in the last 72 hours. Thyroid Function Tests: No results for input(s): TSH, T4TOTAL, FREET4, T3FREE, THYROIDAB in the last 72 hours. Anemia Panel: No results for input(s): VITAMINB12, FOLATE, FERRITIN, TIBC, IRON, RETICCTPCT in the last 72 hours. Sepsis Labs: No results for input(s): PROCALCITON, LATICACIDVEN in the last 168 hours.  No results found for this or any previous visit (from the past 240 hour(s)).       Radiology Studies: Dg Chest 2 View  Result Date:  04/30/2018 CLINICAL DATA:  Shortness of breath. EXAM: CHEST - 2 VIEW COMPARISON:  Radiographs of May 28th 2019. FINDINGS: The heart size and mediastinal contours are within normal limits. No pneumothorax or pleural effusion is noted. Stable bibasilar subsegmental atelectasis or scarring. The visualized skeletal structures are unremarkable. IMPRESSION: Stable bibasilar subsegmental atelectasis or scarring. Electronically Signed   By: Marijo Conception, M.D.   On: 04/30/2018 12:58        Scheduled Meds: . enoxaparin (LOVENOX) injection  40 mg Subcutaneous Q24H  . furosemide  40 mg Intravenous Q12H  . hydrALAZINE  25 mg Oral TID  . isosorbide mononitrate  30 mg Oral Daily  . pantoprazole  40 mg Oral BID AC  . sodium chloride flush  3 mL Intravenous Q12H  . tamsulosin  0.4 mg Oral QPC supper   Continuous Infusions: . sodium chloride       LOS: 2 days    Time spent: 35 minutes     Elmarie Shiley, MD [facility] Pager [phone]  If 7PM-7AM, please contact night-coverage [email or web address] Password [facility] 05/02/2018, 8:26 AM

## 2018-05-03 LAB — BASIC METABOLIC PANEL
Anion gap: 10 (ref 5–15)
BUN: 19 mg/dL (ref 6–20)
CHLORIDE: 102 mmol/L (ref 98–111)
CO2: 27 mmol/L (ref 22–32)
Calcium: 8.6 mg/dL — ABNORMAL LOW (ref 8.9–10.3)
Creatinine, Ser: 1.37 mg/dL — ABNORMAL HIGH (ref 0.61–1.24)
GFR calc Af Amer: >60 mL/min (ref >60)
GFR calc non Af Amer: 55 mL/min — ABNORMAL LOW (ref >60)
Glucose, Bld: 93 mg/dL (ref 70–99)
POTASSIUM: 3.5 mmol/L (ref 3.5–5.1)
SODIUM: 139 mmol/L (ref 135–145)

## 2018-05-03 LAB — GLUCOSE, CAPILLARY: GLUCOSE-CAPILLARY: 147 mg/dL — AB (ref 70–99)

## 2018-05-03 MED ORDER — POTASSIUM CHLORIDE CRYS ER 20 MEQ PO TBCR
20.0000 meq | EXTENDED_RELEASE_TABLET | Freq: Once | ORAL | Status: AC
  Administered 2018-05-03: 20 meq via ORAL
  Filled 2018-05-03: qty 1

## 2018-05-03 MED ORDER — FLUTICASONE PROPIONATE 50 MCG/ACT NA SUSP
1.0000 | Freq: Every day | NASAL | Status: DC
  Administered 2018-05-04 – 2018-05-08 (×5): 1 via NASAL
  Filled 2018-05-03: qty 16

## 2018-05-03 NOTE — Progress Notes (Signed)
PROGRESS NOTE    Sheri Prows  WER:[id number] DOB: 10-Jul-1956 DOA: 04/30/2018 PCP: Luetta Nutting, DO    Brief Narrative: 62 year old married male, reportedly on disability, lives with spouse, ambulates with the help of a cane, PMH of HTN, HLD, stage III chronic kidney disease, chronic hypoxic respiratory failure on home oxygen 2 L/min, TIA/CVA, BPH, idiopathic intracranial hypertension, glaucoma, polysubstance abuse (tobacco and crack cocaine), possible hepatic hemangiomas, hospitalized 1/95/0932-6/71/2458 for acute systolic CHF, new low EF 09% (suspected nonischemic cardiomyopathy) at which time cardiology consulted, recommended no inpatient ischemic work-up, nuclear stress test in [facility] had shown EF 32%, presented to ED with few days history of progressively worsening dyspnea, intermittent chest tightness, orthopnea, mild leg edema in the context of noncompliance with any of his prescription medications for approximately 2 months.  Admitted for management of acute on chronic systolic CHF in the context of medication noncompliance and?  Ongoing cocaine abuse (patient denies).    Assessment & Plan:   Principal Problem:   Acute on chronic systolic CHF (congestive heart failure) (HCC) Active Problems:   Cocaine abuse (HCC)   Benign essential HTN   CKD (chronic kidney disease) stage 3, GFR 30-59 ml/min (HCC)   Chronic respiratory failure with hypoxia (HCC)   Hypertensive urgency   Acute on chronic systolic CHF/nonischemic cardiomyopathy ECHO; Left ventricle: The cavity size was severely dilated. There was mild concentric hypertrophy. Systolic function was severely reduced. The estimated ejection fraction was in the range of 20%  to 25%. Severe diffuse hypokinesis. There was an increased relative contribution of atrial contraction to ventricular  filling. Doppler parameters are consistent with abnormal left  ventricular relaxation (grade 1 diastolic dysfunction). - Aortic valve:  Trileaflet; mildly thickened, mildly calcified leaflets. There was trivial regurgitation. - Mitral valve: There was mild regurgitation. - Left atrium: The atrium was moderately dilated. - Pulmonary arteries: PA peak pressure: 37 mm Hg (S). Continue with IV lasix 40 mg  Weight; 168--166. Unclear if weight is accurate , urine out put yesterday 2.7 l  Allergy to lisinopril. No ARB die to AKI.  Counseling provided.   HTN;  Imdur, lasix.  No ACEI/ARB due to renal insufficiency.  Stage III chronic kidney disease May was 1.6, in June 1.22, presented with 1.44 and stable.  Cr stable.    History of TIA/CVA: Resume aspirin and statins.  History of polysubstance abuse (tobacco and cocaine): UDS  Counseling provided.   Chronic respiratory failure with hypoxia.  ? Related to HF.  On oxygen supplementation.     DVT prophylaxis: Lovenox Code Status: full code.  Family Communication none Disposition Plan: remain inpatient for IV lasix.   Consultants:   none   Procedures:  ECHO; - Left ventricle: The cavity size was severely dilated. There was   mild concentric hypertrophy. Systolic function was severely   reduced. The estimated ejection fraction was in the range of 20%   to 25%. Severe diffuse hypokinesis. There was an increased   relative contribution of atrial contraction to ventricular   filling. Doppler parameters are consistent with abnormal left   ventricular relaxation (grade 1 diastolic dysfunction). - Aortic valve: Trileaflet; mildly thickened, mildly calcified   leaflets. There was trivial regurgitation. - Mitral valve: There was mild regurgitation. - Left atrium: The atrium was moderately dilated.  - Pulmonary arteries: PA peak pressure: 37 mm Hg (S).   Antimicrobials:   none   Subjective: Feels lightheaded today.  Dyspnea improving.  Feels weak.   Objective: Vitals:   05/03/18  1240 05/03/18 1322 05/03/18 1452 05/03/18 1647  BP: 106/82 95/67 108/79  127/79  Pulse: 65 (!) 112 69 72  Resp: 17  18 20   Temp: 98 F (36.7 C)  97.7 F (36.5 C) 98.4 F (36.9 C)  TempSrc: Oral  Oral Oral  SpO2: 99%  95% 100%  Weight:      Height:        Intake/Output Summary (Last 24 hours) at 05/03/2018 1712 Last data filed at 05/03/2018 1200 Gross per 24 hour  Intake 846 ml  Output 2241 ml  Net -1395 ml   Filed Weights   05/01/18 0433 05/02/18 0443 05/03/18 0556  Weight: 76.2 kg 75.5 kg 76.1 kg    Examination:  General exam: NAD Respiratory system; Bilateral crackles, no ronchus.  Cardiovascular system: S 1, S 2 RRR Gastrointestinal system: BS present, soft, nt Central nervous system: non focal.  Extremities: symmetric power.  Skin: no rashes.    Data Reviewed: I have personally reviewed following labs and imaging studies  CBC: Recent Labs  Lab 04/30/18 1306  WBC 6.4  HGB 13.5  HCT 40.2  MCV 79.3  PLT 301   Basic Metabolic Panel: Recent Labs  Lab 04/30/18 1306 05/01/18 0223 05/02/18 0451 05/03/18 0512  NA 143 144 140 139  K 3.8 3.3* 3.6 3.5  CL 111 108 104 102  CO2 25 25 26 27   GLUCOSE 117* 137* 114* 93  BUN 23* 21* 22* 19  CREATININE 1.44* 1.49* 1.42* 1.37*  CALCIUM 8.2* 8.5* 8.8* 8.6*   GFR: Estimated Creatinine Clearance: 55.5 mL/min (A) (by C-G formula based on SCr of 1.37 mg/dL (H)). Liver Function Tests: Recent Labs  Lab 04/30/18 1306  AST 20  ALT 17  ALKPHOS 53  BILITOT 0.5  PROT 6.1*  ALBUMIN 3.0*   No results for input(s): LIPASE, AMYLASE in the last 168 hours. No results for input(s): AMMONIA in the last 168 hours. Coagulation Profile: No results for input(s): INR, PROTIME in the last 168 hours. Cardiac Enzymes: Recent Labs  Lab 04/30/18 1306 04/30/18 2007 05/01/18 0223  TROPONINI 0.03* <0.03 <0.03   BNP (last 3 results) No results for input(s): PROBNP in the last 8760 hours. HbA1C: No results for input(s): HGBA1C in the last 72 hours. CBG: Recent Labs  Lab 05/03/18 0736  GLUCAP  147*   Lipid Profile: No results for input(s): CHOL, HDL, LDLCALC, TRIG, CHOLHDL, LDLDIRECT in the last 72 hours. Thyroid Function Tests: No results for input(s): TSH, T4TOTAL, FREET4, T3FREE, THYROIDAB in the last 72 hours. Anemia Panel: No results for input(s): VITAMINB12, FOLATE, FERRITIN, TIBC, IRON, RETICCTPCT in the last 72 hours. Sepsis Labs: No results for input(s): PROCALCITON, LATICACIDVEN in the last 168 hours.  No results found for this or any previous visit (from the past 240 hour(s)).       Radiology Studies: No results found.      Scheduled Meds: . enoxaparin (LOVENOX) injection  40 mg Subcutaneous Q24H  . fluticasone  1 spray Each Nare Daily  . furosemide  40 mg Intravenous Q12H  . hydrALAZINE  25 mg Oral TID  . isosorbide mononitrate  30 mg Oral Daily  . pantoprazole  40 mg Oral BID AC  . sodium chloride flush  3 mL Intravenous Q12H  . tamsulosin  0.4 mg Oral QPC supper   Continuous Infusions: . sodium chloride       LOS: 3 days    Time spent: 35 minutes     Belkys Desiree Lucy, MD  [facility] Pager [phone]  If 7PM-7AM, please contact night-coverage [email or web address] Password [facility] 05/03/2018, 5:12 PM

## 2018-05-04 LAB — BASIC METABOLIC PANEL
ANION GAP: 10 (ref 5–15)
BUN: 18 mg/dL (ref 6–20)
CHLORIDE: 104 mmol/L (ref 98–111)
CO2: 25 mmol/L (ref 22–32)
Calcium: 8.8 mg/dL — ABNORMAL LOW (ref 8.9–10.3)
Creatinine, Ser: 1.27 mg/dL — ABNORMAL HIGH (ref 0.61–1.24)
GFR calc Af Amer: >60 mL/min (ref >60)
GFR, EST NON AFRICAN AMERICAN: 60 mL/min — AB (ref >60)
GLUCOSE: 86 mg/dL (ref 70–99)
POTASSIUM: 3.6 mmol/L (ref 3.5–5.1)
SODIUM: 139 mmol/L (ref 135–145)

## 2018-05-04 MED ORDER — FUROSEMIDE 10 MG/ML IJ SOLN
40.0000 mg | Freq: Three times a day (TID) | INTRAMUSCULAR | Status: DC
  Administered 2018-05-04 – 2018-05-07 (×9): 40 mg via INTRAVENOUS
  Filled 2018-05-04 (×9): qty 4

## 2018-05-04 MED ORDER — POTASSIUM CHLORIDE CRYS ER 20 MEQ PO TBCR
20.0000 meq | EXTENDED_RELEASE_TABLET | Freq: Once | ORAL | Status: AC
  Administered 2018-05-04: 20 meq via ORAL
  Filled 2018-05-04: qty 1

## 2018-05-04 NOTE — Care Management Important Message (Signed)
Important Message  Patient Details  Name: Terrence Garcia MRN: [id number] Date of Birth: 03/13/1956   Medicare Important Message Given:  Yes Patient asleep, unsigned copy left at bedside   Delorse Lek 05/04/2018, 4:42 PM

## 2018-05-04 NOTE — Plan of Care (Signed)
  Problem: Clinical Measurements: Goal: Ability to maintain clinical measurements within normal limits will improve Outcome: Progressing   Problem: Clinical Measurements: Goal: Diagnostic test results will improve Outcome: Progressing   Problem: Clinical Measurements: Goal: Cardiovascular complication will be avoided Outcome: Progressing   

## 2018-05-04 NOTE — Progress Notes (Signed)
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  Admitted for management of acute on chronic systolic CHF in the context of medication noncompliance and?  Ongoing cocaine abuse (patient denies).    Assessment & Plan:   Principal Problem:   Acute on chronic systolic CHF (congestive heart failure) (HCC) Active Problems:   Cocaine abuse (HCC)   Benign essential HTN   CKD (chronic kidney disease) stage 3, GFR 30-59 ml/min (HCC)   Chronic respiratory failure with hypoxia (HCC)   Hypertensive urgency   Acute on chronic systolic CHF/nonischemic cardiomyopathy ECHO; Left ventricle: The cavity size was severely dilated. There was mild concentric hypertrophy. Systolic function was severely reduced. The estimated ejection fraction was in the range of 20%  to 25%. Severe diffuse hypokinesis. There was an increased relative contribution of atrial contraction to ventricular  filling. Doppler parameters are consistent with abnormal left  ventricular relaxation (grade 1 diastolic dysfunction). - Aortic valve:  Trileaflet; mildly thickened, mildly calcified leaflets. There was trivial regurgitation. - Mitral valve: There was mild regurgitation. - Left atrium: The atrium was moderately dilated. - Pulmonary arteries: PA peak pressure: 37 mm Hg (S). -weight up again, 166---167---169 Allergy to lisinopril. No ARB die to AKI.  Counseling provided.  Complaining of SOB, weight up. Will change lasix to 40 mg IV TID>   HTN;  Imdur, lasix.  No ACEI/ARB due to renal insufficiency.  Stage III chronic kidney disease May was 1.6, in June 1.22, presented with 1.44 and stable.  Cr stable.    History of TIA/CVA: Resume aspirin and statins.  History of polysubstance abuse (tobacco and cocaine): UDS  Counseling provided.   Chronic respiratory failure with hypoxia.  ? Related to HF.  On oxygen supplementation.     DVT prophylaxis: Lovenox Code Status: full code.  Family Communication none Disposition Plan: Remain inpatient for management of HF, need to continue with IV lasix.   Consultants:   none   Procedures:  ECHO; - Left ventricle: The cavity size was severely dilated. There was   mild concentric hypertrophy. Systolic function was severely   reduced. The estimated ejection fraction was in the range of 20%   to 25%. Severe diffuse hypokinesis. There was an increased   relative contribution of atrial contraction to ventricular   filling. Doppler parameters are consistent with abnormal left   ventricular relaxation (grade 1 diastolic dysfunction). - Aortic valve: Trileaflet; mildly thickened, mildly calcified   leaflets. There was trivial regurgitation. - Mitral valve: There was mild regurgitation. - Left atrium: The atrium was moderately dilated.  - Pulmonary arteries: PA peak pressure: 37 mm Hg (S).   Antimicrobials:   none   Subjective: Report SOB, denies dizziness.  Feeling weak.   Objective: Vitals:  05/03/18 1952 05/04/18 0305 05/04/18 0427 05/04/18 1149  BP: 108/69   100/66 110/80  Pulse: 79  69 79  Resp: 20  20 18   Temp: 98 F (36.7 C)  98.5 F (36.9 C) 98.7 F (37.1 C)  TempSrc: Oral  Oral Oral  SpO2: 96%  92% 98%  Weight:  77 kg    Height:        Intake/Output Summary (Last 24 hours) at 05/04/2018 1348 Last data filed at 05/04/2018 1148 Gross per 24 hour  Intake 900 ml  Output 2025 ml  Net -1125 ml   Filed Weights   05/02/18 0443 05/03/18 0556 05/04/18 0305  Weight: 75.5 kg 76.1 kg 77 kg    Examination:  General exam: NAD Respiratory system; Normal respiratory effort, bilateral crackles.  Cardiovascular system: S 1, S 2 RRR Gastrointestinal system: BS present, soft, nt Central nervous system: Non Focal.  Extremities: Symmetric power. Trace edema Skin: No rashes.    Data Reviewed: I have personally reviewed following labs and imaging studies  CBC: Recent Labs  Lab 04/30/18 1306  WBC 6.4  HGB 13.5  HCT 40.2  MCV 79.3  PLT 616   Basic Metabolic Panel: Recent Labs  Lab 04/30/18 1306 05/01/18 0223 05/02/18 0451 05/03/18 0512 05/04/18 0504  NA 143 144 140 139 139  K 3.8 3.3* 3.6 3.5 3.6  CL 111 108 104 102 104  CO2 25 25 26 27 25   GLUCOSE 117* 137* 114* 93 86  BUN 23* 21* 22* 19 18  CREATININE 1.44* 1.49* 1.42* 1.37* 1.27*  CALCIUM 8.2* 8.5* 8.8* 8.6* 8.8*   GFR: Estimated Creatinine Clearance: 59.8 mL/min (A) (by C-G formula based on SCr of 1.27 mg/dL (H)). Liver Function Tests: Recent Labs  Lab 04/30/18 1306  AST 20  ALT 17  ALKPHOS 53  BILITOT 0.5  PROT 6.1*  ALBUMIN 3.0*   No results for input(s): LIPASE, AMYLASE in the last 168 hours. No results for input(s): AMMONIA in the last 168 hours. Coagulation Profile: No results for input(s): INR, PROTIME in the last 168 hours. Cardiac Enzymes: Recent Labs  Lab 04/30/18 1306 04/30/18 2007 05/01/18 0223  TROPONINI 0.03* <0.03 <0.03   BNP (last 3 results) No results for input(s): PROBNP in the last 8760 hours. HbA1C: No results for input(s):  HGBA1C in the last 72 hours. CBG: Recent Labs  Lab 05/03/18 0736  GLUCAP 147*   Lipid Profile: No results for input(s): CHOL, HDL, LDLCALC, TRIG, CHOLHDL, LDLDIRECT in the last 72 hours. Thyroid Function Tests: No results for input(s): TSH, T4TOTAL, FREET4, T3FREE, THYROIDAB in the last 72 hours. Anemia Panel: No results for input(s): VITAMINB12, FOLATE, FERRITIN, TIBC, IRON, RETICCTPCT in the last 72 hours. Sepsis Labs: No results for input(s): PROCALCITON, LATICACIDVEN in the last 168 hours.  No results found for this or any previous visit (from the past 240 hour(s)).       Radiology Studies: No results found.      Scheduled Meds: . enoxaparin (LOVENOX) injection  40 mg Subcutaneous Q24H  . fluticasone  1 spray Each Nare Daily  . furosemide  40 mg Intravenous Q8H  . hydrALAZINE  25 mg Oral TID  . isosorbide mononitrate  30 mg Oral Daily  . pantoprazole  40 mg Oral BID AC  . potassium chloride  20 mEq Oral Once  . sodium chloride flush  3 mL Intravenous Q12H  . tamsulosin  0.4 mg Oral QPC supper   Continuous Infusions: . sodium chloride  LOS: 4 days    Time spent: 35 minutes     Elmarie Shiley, MD [facility] Pager [phone]  If 7PM-7AM, please contact night-coverage [email or web address] Password [email or web address] 05/04/2018, 1:48 PM

## 2018-05-05 LAB — BASIC METABOLIC PANEL
ANION GAP: 8 (ref 5–15)
BUN: 24 mg/dL — ABNORMAL HIGH (ref 6–20)
CHLORIDE: 99 mmol/L (ref 98–111)
CO2: 29 mmol/L (ref 22–32)
Calcium: 8.7 mg/dL — ABNORMAL LOW (ref 8.9–10.3)
Creatinine, Ser: 1.44 mg/dL — ABNORMAL HIGH (ref 0.61–1.24)
GFR calc Af Amer: 60 mL/min — ABNORMAL LOW (ref >60)
GFR calc non Af Amer: 51 mL/min — ABNORMAL LOW (ref >60)
GLUCOSE: 100 mg/dL — AB (ref 70–99)
POTASSIUM: 3.6 mmol/L (ref 3.5–5.1)
Sodium: 136 mmol/L (ref 135–145)

## 2018-05-05 LAB — CBC
HEMATOCRIT: 46.5 % (ref 39.0–52.0)
HEMOGLOBIN: 14.7 g/dL (ref 13.0–17.0)
MCH: 25.9 pg — AB (ref 26.0–34.0)
MCHC: 31.6 g/dL (ref 30.0–36.0)
MCV: 82 fL (ref 78.0–100.0)
Platelets: 266 10*3/uL (ref 150–400)
RBC: 5.67 MIL/uL (ref 4.22–5.81)
RDW: 15.5 % (ref 11.5–15.5)
WBC: 5.7 10*3/uL (ref 4.0–10.5)

## 2018-05-05 NOTE — Plan of Care (Signed)
  Problem: Education: Goal: Knowledge of General Education information will improve Description Including pain rating scale, medication(s)/side effects and non-pharmacologic comfort measures Outcome: Progressing   Problem: Health Behavior/Discharge Planning: Goal: Ability to manage health-related needs will improve Outcome: Progressing   Problem: Safety: Goal: Ability to remain free from injury will improve Outcome: Progressing   Problem: Activity: Goal: Capacity to carry out activities will improve Outcome: Progressing   

## 2018-05-05 NOTE — Progress Notes (Signed)
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  Admitted for management of acute on chronic systolic CHF in the context of medication noncompliance and?  Ongoing cocaine abuse (patient denies).    Assessment & Plan:   Principal Problem:   Acute on chronic systolic CHF (congestive heart failure) (HCC) Active Problems:   Cocaine abuse (HCC)   Benign essential HTN   CKD (chronic kidney disease) stage 3, GFR 30-59 ml/min (HCC)   Chronic respiratory failure with hypoxia (HCC)   Hypertensive urgency   Acute on chronic systolic CHF/nonischemic cardiomyopathy ECHO; Left ventricle: The cavity size was severely dilated. There was mild concentric hypertrophy. Systolic function was severely reduced. The estimated ejection fraction was in the range of 20%  to 25%. Severe diffuse hypokinesis. There was an increased relative contribution of atrial contraction to ventricular  filling. Doppler parameters are consistent with abnormal left  ventricular relaxation (grade 1 diastolic dysfunction). - Aortic valve:  Trileaflet; mildly thickened, mildly calcified leaflets. There was trivial regurgitation. - Mitral valve: There was mild regurgitation. - Left atrium: The atrium was moderately dilated. - Pulmonary arteries: PA peak pressure: 37 mm Hg (S). -weight up again, 166---167---169 Allergy to lisinopril. No ARB die to AKI.  Counseling provided.  Continue with lasix to 40 mg IV TID>  Breathing better, weight stable. Good urine out put.   HTN;  Imdur, lasix.  No ACEI/ARB due to renal insufficiency.  Stage III chronic kidney disease May was 1.6, in June 1.22, presented with 1.44 and stable.  Cr stable. 1.4   History of TIA/CVA: Resume aspirin and statins.  History of polysubstance abuse (tobacco and cocaine): UDS  Counseling provided.   Chronic respiratory failure with hypoxia.  ? Related to HF.  On oxygen supplementation.     DVT prophylaxis: Lovenox Code Status: full code.  Family Communication none Disposition Plan: Remain inpatient for management of HF, need to continue with IV lasix.   Consultants:   none   Procedures:  ECHO; - Left ventricle: The cavity size was severely dilated. There was   mild concentric hypertrophy. Systolic function was severely   reduced. The estimated ejection fraction was in the range of 20%   to 25%. Severe diffuse hypokinesis. There was an increased   relative contribution of atrial contraction to ventricular   filling. Doppler parameters are consistent with abnormal left   ventricular relaxation (grade 1 diastolic dysfunction). - Aortic valve: Trileaflet; mildly thickened, mildly calcified   leaflets. There was trivial regurgitation. - Mitral valve: There was mild regurgitation. - Left atrium: The atrium was moderately dilated.  - Pulmonary arteries: PA peak pressure: 37 mm Hg (S).   Antimicrobials:   none   Subjective: Breathing improved. Not at baseline. Weight not at baseline  Objective: Vitals:   05/04/18 2044 05/05/18 0325  05/05/18 1024 05/05/18 1205  BP: 109/69 (!) 144/74 108/81 106/81  Pulse: 77 81  86  Resp: 18 18  18   Temp: 98.3 F (36.8 C) 97.9 F (36.6 C)  98.5 F (36.9 C)  TempSrc: Oral Oral  Oral  SpO2: 94% 100%  99%  Weight:  77 kg    Height:        Intake/Output Summary (Last 24 hours) at 05/05/2018 1533 Last data filed at 05/05/2018 1027 Gross per 24 hour  Intake 840 ml  Output 1500 ml  Net -660 ml   Filed Weights   05/03/18 0556 05/04/18 0305 05/05/18 0325  Weight: 76.1 kg 77 kg 77 kg    Examination:  General exam: NAD Respiratory system; Bilateral crackles.  Cardiovascular system: S 1, S 2 RRR Gastrointestinal system: BS present, soft, nt Central nervous system: Non focal.  Extremities: trace edema Skin: No rashes    Data Reviewed: I have personally reviewed following labs and imaging studies  CBC: Recent Labs  Lab 04/30/18 1306 05/05/18 0604  WBC 6.4 5.7  HGB 13.5 14.7  HCT 40.2 46.5  MCV 79.3 82.0  PLT 229 272   Basic Metabolic Panel: Recent Labs  Lab 05/01/18 0223 05/02/18 0451 05/03/18 0512 05/04/18 0504 05/05/18 0604  NA 144 140 139 139 136  K 3.3* 3.6 3.5 3.6 3.6  CL 108 104 102 104 99  CO2 25 26 27 25 29   GLUCOSE 137* 114* 93 86 100*  BUN 21* 22* 19 18 24*  CREATININE 1.49* 1.42* 1.37* 1.27* 1.44*  CALCIUM 8.5* 8.8* 8.6* 8.8* 8.7*   GFR: Estimated Creatinine Clearance: 52.8 mL/min (A) (by C-G formula based on SCr of 1.44 mg/dL (H)). Liver Function Tests: Recent Labs  Lab 04/30/18 1306  AST 20  ALT 17  ALKPHOS 53  BILITOT 0.5  PROT 6.1*  ALBUMIN 3.0*   No results for input(s): LIPASE, AMYLASE in the last 168 hours. No results for input(s): AMMONIA in the last 168 hours. Coagulation Profile: No results for input(s): INR, PROTIME in the last 168 hours. Cardiac Enzymes: Recent Labs  Lab 04/30/18 1306 04/30/18 2007 05/01/18 0223  TROPONINI 0.03* <0.03 <0.03   BNP (last 3 results) No results for input(s): PROBNP in the last 8760  hours. HbA1C: No results for input(s): HGBA1C in the last 72 hours. CBG: Recent Labs  Lab 05/03/18 0736  GLUCAP 147*   Lipid Profile: No results for input(s): CHOL, HDL, LDLCALC, TRIG, CHOLHDL, LDLDIRECT in the last 72 hours. Thyroid Function Tests: No results for input(s): TSH, T4TOTAL, FREET4, T3FREE, THYROIDAB in the last 72 hours. Anemia Panel: No results for input(s): VITAMINB12, FOLATE, FERRITIN, TIBC, IRON, RETICCTPCT in the last 72 hours. Sepsis Labs: No results for input(s): PROCALCITON, LATICACIDVEN in the last 168 hours.  No results found for this or any previous visit (from the past 240 hour(s)).       Radiology Studies: No results found.      Scheduled Meds: . enoxaparin (LOVENOX) injection  40 mg Subcutaneous Q24H  . fluticasone  1 spray Each Nare Daily  . furosemide  40 mg Intravenous Q8H  . hydrALAZINE  25 mg Oral TID  . isosorbide mononitrate  30 mg Oral Daily  . pantoprazole  40 mg Oral BID AC  . sodium chloride flush  3 mL Intravenous Q12H  . tamsulosin  0.4 mg Oral QPC supper   Continuous Infusions: . sodium chloride  LOS: 5 days    Time spent: 35 minutes     Elmarie Shiley, MD [facility] Pager [phone]  If 7PM-7AM, please contact night-coverage [email or web address] Password [facility] 05/05/2018, 3:33 PM

## 2018-05-06 LAB — BASIC METABOLIC PANEL
Anion gap: 12 (ref 5–15)
BUN: 38 mg/dL — ABNORMAL HIGH (ref 6–20)
CO2: 28 mmol/L (ref 22–32)
CREATININE: 1.42 mg/dL — AB (ref 0.61–1.24)
Calcium: 9 mg/dL (ref 8.9–10.3)
Chloride: 96 mmol/L — ABNORMAL LOW (ref 98–111)
GFR calc Af Amer: >60 mL/min (ref >60)
GFR calc non Af Amer: 52 mL/min — ABNORMAL LOW (ref >60)
GLUCOSE: 103 mg/dL — AB (ref 70–99)
POTASSIUM: 3.3 mmol/L — AB (ref 3.5–5.1)
Sodium: 136 mmol/L (ref 135–145)

## 2018-05-06 MED ORDER — POTASSIUM CHLORIDE CRYS ER 20 MEQ PO TBCR
40.0000 meq | EXTENDED_RELEASE_TABLET | Freq: Once | ORAL | Status: AC
  Administered 2018-05-06: 40 meq via ORAL

## 2018-05-06 NOTE — Progress Notes (Signed)
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  Admitted for management of acute on chronic systolic CHF in the context of medication noncompliance and?  Ongoing cocaine abuse (patient denies).    Assessment & Plan:   Principal Problem:   Acute on chronic systolic CHF (congestive heart failure) (HCC) Active Problems:   Cocaine abuse (HCC)   Benign essential HTN   CKD (chronic kidney disease) stage 3, GFR 30-59 ml/min (HCC)   Chronic respiratory failure with hypoxia (HCC)   Hypertensive urgency   Acute on chronic systolic CHF/nonischemic cardiomyopathy ECHO; Left ventricle: The cavity size was severely dilated. There was mild concentric hypertrophy. Systolic function was severely reduced. The estimated ejection fraction was in the range of 20%  to 25%. Severe diffuse hypokinesis. There was an increased relative contribution of atrial contraction to ventricular  filling. Doppler parameters are consistent with abnormal left  ventricular relaxation (grade 1 diastolic dysfunction). - Aortic valve:  Trileaflet; mildly thickened, mildly calcified leaflets. There was trivial regurgitation. - Mitral valve: There was mild regurgitation. - Left atrium: The atrium was moderately dilated. - Pulmonary arteries: PA peak pressure: 37 mm Hg (S). -weight up again, 166---167---169---168 Allergy to lisinopril. No ARB die to AKI.  Counseling provided.  Continue with lasix to 40 mg IV TID>  Still complaining of dyspnea.  -had stress test negative for reversible ischemia.  Will consult cardio in am.   HTN;  Imdur, lasix.  No ACEI/ARB due to renal insufficiency.  Stage III chronic kidney disease May was 1.6, in June 1.22, presented with 1.44 and stable.  Cr stable. 1.4    History of TIA/CVA: Resume aspirin and statins.  History of polysubstance abuse (tobacco and cocaine): UDS  Counseling provided.   Chronic respiratory failure with hypoxia.  ? Related to HF.  On oxygen supplementation.     DVT prophylaxis: Lovenox Code Status: full code.  Family Communication none Disposition Plan: Remain inpatient for management of HF, need to continue with IV lasix.   Consultants:   none   Procedures:  ECHO; - Left ventricle: The cavity size was severely dilated. There was   mild concentric hypertrophy. Systolic function was severely   reduced. The estimated ejection fraction was in the range of 20%   to 25%. Severe diffuse hypokinesis. There was an increased   relative contribution of atrial contraction to ventricular   filling. Doppler parameters are consistent with abnormal left   ventricular relaxation (grade 1 diastolic dysfunction). - Aortic valve: Trileaflet; mildly thickened, mildly calcified   leaflets. There was trivial regurgitation. - Mitral valve: There was mild regurgitation. - Left atrium: The atrium was moderately dilated.  - Pulmonary arteries: PA peak pressure: 37 mm Hg (S).   Antimicrobials:   none  Subjective: He is still having dyspnea, just sitting in  recliner.    Objective: Vitals:   05/05/18 1945 05/05/18 2210 05/06/18 0441 05/06/18 0933  BP: 108/66 121/85 121/88 127/75  Pulse: 80  67   Resp: 18  18   Temp: 98.5 F (36.9 C)  98.1 F (36.7 C)   TempSrc: Oral  Oral   SpO2: 99%  100%   Weight:   76.5 kg   Height:        Intake/Output Summary (Last 24 hours) at 05/06/2018 1526 Last data filed at 05/06/2018 1427 Gross per 24 hour  Intake 1600 ml  Output 3925 ml  Net -2325 ml   Filed Weights   05/04/18 0305 05/05/18 0325 05/06/18 0441  Weight: 77 kg 77 kg 76.5 kg    Examination:  General exam: NAD Respiratory system; Bilateral crackles.  Cardiovascular system: S 1, S 2 RRR Gastrointestinal system: BS present soft, nt Central nervous system: non focal.  Extremities: Trace edema Skin: No rashes    Data Reviewed: I have personally reviewed following labs and imaging studies  CBC: Recent Labs  Lab 04/30/18 1306 05/05/18 0604  WBC 6.4 5.7  HGB 13.5 14.7  HCT 40.2 46.5  MCV 79.3 82.0  PLT 229 409   Basic Metabolic Panel: Recent Labs  Lab 05/02/18 0451 05/03/18 0512 05/04/18 0504 05/05/18 0604 05/06/18 0727  NA 140 139 139 136 136  K 3.6 3.5 3.6 3.6 3.3*  CL 104 102 104 99 96*  CO2 26 27 25 29 28   GLUCOSE 114* 93 86 100* 103*  BUN 22* 19 18 24* 38*  CREATININE 1.42* 1.37* 1.27* 1.44* 1.42*  CALCIUM 8.8* 8.6* 8.8* 8.7* 9.0   GFR: Estimated Creatinine Clearance: 53.5 mL/min (A) (by C-G formula based on SCr of 1.42 mg/dL (H)). Liver Function Tests: Recent Labs  Lab 04/30/18 1306  AST 20  ALT 17  ALKPHOS 53  BILITOT 0.5  PROT 6.1*  ALBUMIN 3.0*   No results for input(s): LIPASE, AMYLASE in the last 168 hours. No results for input(s): AMMONIA in the last 168 hours. Coagulation Profile: No results for input(s): INR, PROTIME in the last 168 hours. Cardiac Enzymes: Recent Labs  Lab 04/30/18 1306 04/30/18 2007 05/01/18 0223  TROPONINI 0.03* <0.03 <0.03   BNP (last 3 results) No results for  input(s): PROBNP in the last 8760 hours. HbA1C: No results for input(s): HGBA1C in the last 72 hours. CBG: Recent Labs  Lab 05/03/18 0736  GLUCAP 147*   Lipid Profile: No results for input(s): CHOL, HDL, LDLCALC, TRIG, CHOLHDL, LDLDIRECT in the last 72 hours. Thyroid Function Tests: No results for input(s): TSH, T4TOTAL, FREET4, T3FREE, THYROIDAB in the last 72 hours. Anemia Panel: No results for input(s): VITAMINB12, FOLATE, FERRITIN, TIBC, IRON, RETICCTPCT in the last 72 hours. Sepsis Labs: No results for input(s): PROCALCITON, LATICACIDVEN in the last 168 hours.  No results found for this or any previous visit (from the past 240 hour(s)).       Radiology Studies: No results found.      Scheduled Meds: . enoxaparin (LOVENOX) injection  40 mg Subcutaneous Q24H  . fluticasone  1 spray Each Nare Daily  . furosemide  40 mg Intravenous Q8H  . hydrALAZINE  25 mg Oral TID  . isosorbide mononitrate  30 mg Oral Daily  . pantoprazole  40 mg Oral BID AC  . sodium chloride flush  3 mL Intravenous Q12H  . tamsulosin  0.4 mg Oral QPC supper  Continuous Infusions: . sodium chloride       LOS: 6 days    Time spent: 35 minutes     Elmarie Shiley, MD [facility] Pager [phone]  If 7PM-7AM, please contact night-coverage [email or web address] Password [email or web address] 05/06/2018, 3:26 PM

## 2018-05-06 NOTE — Plan of Care (Signed)
  Problem: Clinical Measurements: Goal: Ability to maintain clinical measurements within normal limits will improve Outcome: Progressing   Problem: Clinical Measurements: Goal: Respiratory complications will improve Outcome: Progressing   Problem: Clinical Measurements: Goal: Cardiovascular complication will be avoided Outcome: Progressing   

## 2018-05-07 ENCOUNTER — Inpatient Hospital Stay (HOSPITAL_COMMUNITY): Payer: Medicare Other

## 2018-05-07 DIAGNOSIS — I5023 Acute on chronic systolic (congestive) heart failure: Secondary | ICD-10-CM

## 2018-05-07 DIAGNOSIS — N183 Chronic kidney disease, stage 3 (moderate): Secondary | ICD-10-CM

## 2018-05-07 DIAGNOSIS — F1410 Cocaine abuse, uncomplicated: Secondary | ICD-10-CM

## 2018-05-07 LAB — BASIC METABOLIC PANEL
Anion gap: 11 (ref 5–15)
BUN: 37 mg/dL — ABNORMAL HIGH (ref 6–20)
CALCIUM: 9 mg/dL (ref 8.9–10.3)
CHLORIDE: 99 mmol/L (ref 98–111)
CO2: 30 mmol/L (ref 22–32)
Creatinine, Ser: 1.67 mg/dL — ABNORMAL HIGH (ref 0.61–1.24)
GFR, EST AFRICAN AMERICAN: 50 mL/min — AB (ref >60)
GFR, EST NON AFRICAN AMERICAN: 43 mL/min — AB (ref >60)
GLUCOSE: 109 mg/dL — AB (ref 70–99)
Potassium: 3.6 mmol/L (ref 3.5–5.1)
Sodium: 140 mmol/L (ref 135–145)

## 2018-05-07 LAB — TROPONIN I: Troponin I: <0.03 ng/mL (ref <0.03)

## 2018-05-07 LAB — CREATININE, SERUM
Creatinine, Ser: 1.71 mg/dL — ABNORMAL HIGH (ref 0.61–1.24)
GFR calc Af Amer: 48 mL/min — ABNORMAL LOW (ref >60)
GFR calc non Af Amer: 42 mL/min — ABNORMAL LOW (ref >60)

## 2018-05-07 MED ORDER — NITROGLYCERIN 0.4 MG SL SUBL
SUBLINGUAL_TABLET | SUBLINGUAL | Status: AC
  Administered 2018-05-07: 22:00:00
  Filled 2018-05-07: qty 1

## 2018-05-07 MED ORDER — NITROGLYCERIN IN D5W 200-5 MCG/ML-% IV SOLN: 0.0000 ug/min | INTRAVENOUS | Status: DC

## 2018-05-07 NOTE — Consult Note (Addendum)
Cardiology Consultation:   Patient ID: Terrence Garcia MRN: [id number]; DOB: 1956/01/02  Admit date: 04/30/2018 Date of Consult: 05/07/2018  Primary Care Provider: Luetta Nutting, DO Primary Cardiologist: remotely by Romelle Starcher, unable to see any further, last seen by Dr. Meda Coffee in May 2019 Primary Electrophysiologist:  None    Patient Profile:   Terrence Garcia is a 62 y.o. male with a hx of HTN, HLD, TIA/CVA, intracranial hypertension, and history of polysubstance abuse who is being seen today for the evaluation of acute on chronic systolic heart failure at the request of Dr Tyrell Antonio.  History of Present Illness:   Terrence Garcia is a 62 year old male with past medical history of  HTN, HLD, TIA/CVA, intracranial hypertension, and history of polysubstance abuse.  He was seen remotely by [facility], however has not been able to see due to insurance reasons.  He had a TIA in September 2018.  His stress test at that time showed no evidence of prior infarction or ischemia, EF 32%.  Echocardiogram showed EF 55 to 60%, no significant valvular abnormality.  He was admitted in May 2019 with shortness of breath, orthopnea and PND.  Echocardiogram obtained at the time showed EF 20-25% with diffuse hypokinesis, mild LVH, moderate LAE, small pericardial effusion was mild RA collapse.  Patient was diuresed and discharged with a weight of 165 pounds on 40 mg daily of Lasix and 25 mg daily of spironolactone.  However his weight had significant day to day fluctuation and likely was not very accurate.  He was not placed on ACE inhibitor or ARB due to allergy and chronic kidney disease.  Beta-blocker was not initiated due to active cocaine use at the time.  He was discharged on Imdur and hydralazine instead.   For the past 2 months, he has not taken any of his cardiac medication after running out.  He says his phone broke and I could not call the office for refill.  He also mentions he is in a very bad living  situation with his wife and the her girlfriend constantly smoking cocaine.  He says he has not smoked cocaine himself however has been around cocaine on a daily basis.  He is thinking about leaving his wife due ongoing substance abuse issue.  He presented to [facility]/30/2018 with lower extremity edema, orthopnea and PND.  He underwent IV diuresis with 40 mg twice daily of IV Lasix.  At this point, he is no longer short of breath when he lays down.  However feels dizzy and short of breath when he walks around.  Cardiology has been consulted for management of his heart failure.   Past Medical History:  Diagnosis Date  . CHF (congestive heart failure) ([place])   . Chronic back pain   . Dyspnea   . Gastric ulcer   . Hypertension   . Prostate enlargement   . Stroke [facility])     Past Surgical History:  Procedure Laterality Date  . BACK SURGERY       Home Medications:  Prior to Admission medications   Medication Sig Start Date End Date Taking? Authorizing Provider  carvedilol (COREG) 3.125 MG tablet Take 1 tablet (3.125 mg total) by mouth 2 (two) times daily. 01/03/18 01/03/19 Yes Debbe Odea, MD  furosemide (LASIX) 40 MG tablet Take 1 tablet (40 mg total) by mouth daily. 01/03/18 01/03/19 Yes Debbe Odea, MD  hydrALAZINE (APRESOLINE) 25 MG tablet Take 1 tablet (25 mg total) by mouth 3 (three) times daily.  01/03/18  Yes Debbe Odea, MD  isosorbide mononitrate (IMDUR) 30 MG 24 hr tablet Take 1 tablet (30 mg total) by mouth daily. 01/04/18  Yes Debbe Odea, MD  omeprazole (PRILOSEC) 40 MG capsule Take 1 capsule (40 mg total) by mouth 2 (two) times daily. 01/08/18  Yes Luetta Nutting, DO  spironolactone (ALDACTONE) 25 MG tablet Take 1 tablet (25 mg total) by mouth daily. 01/03/18 01/03/19 Yes Debbe Odea, MD  tamsulosin (FLOMAX) 0.4 MG CAPS capsule Take 1 capsule (0.4 mg total) by mouth daily after supper. 01/03/18  Yes Debbe Odea, MD    Inpatient Medications: Scheduled Meds: .  enoxaparin (LOVENOX) injection  40 mg Subcutaneous Q24H  . fluticasone  1 spray Each Nare Daily  . hydrALAZINE  25 mg Oral TID  . isosorbide mononitrate  30 mg Oral Daily  . pantoprazole  40 mg Oral BID AC  . sodium chloride flush  3 mL Intravenous Q12H  . tamsulosin  0.4 mg Oral QPC supper   Continuous Infusions: . sodium chloride     PRN Meds: sodium chloride, acetaminophen, ondansetron (ZOFRAN) IV, sodium chloride flush  Allergies:    Allergies  Allergen Reactions  . Lisinopril Swelling  . Iodinated Diagnostic Agents Nausea And Vomiting  . Penicillins Other (See Comments)    Has patient had a PCN reaction causing immediate rash, facial/tongue/throat swelling, SOB or lightheadedness with hypotension: no Has patient had a PCN reaction causing severe rash involving mucus membranes or skin necrosis: no Has patient had a PCN reaction that required hospitalization: no Has patient had a PCN reaction occurring within the last 10 years: No If all of the above answers are "NO", then may proceed with Cephalosporin use. "pt states does nothing for him"    Social History:   Social History   Socioeconomic History  . Marital status: Married    Spouse name: Not on file  . Number of children: Not on file  . Years of education: Not on file  . Highest education level: Not on file  Occupational History  . Not on file  Social Needs  . Financial resource strain: Not on file  . Food insecurity:    Worry: Not on file    Inability: Not on file  . Transportation needs:    Medical: Not on file    Non-medical: Not on file  Tobacco Use  . Smoking status: Former Smoker    Last attempt to quit: 01/01/2018    Years since quitting: 0.3  . Smokeless tobacco: Never Used  Substance and Sexual Activity  . Alcohol use: No  . Drug use: Yes    Types: Cocaine    Comment: states sober for 14 years and used crack two days ago   . Sexual activity: Not on file  Lifestyle  . Physical activity:     Days per week: Not on file    Minutes per session: Not on file  . Stress: Not on file  Relationships  . Social connections:    Talks on phone: Not on file    Gets together: Not on file    Attends religious service: Not on file    Active member of club or organization: Not on file    Attends meetings of clubs or organizations: Not on file    Relationship status: Not on file  . Intimate partner violence:    Fear of current or ex partner: Not on file    Emotionally abused: Not on file    Physically  abused: Not on file    Forced sexual activity: Not on file  Other Topics Concern  . Not on file  Social History Narrative  . Not on file    Family History:   History reviewed. No pertinent family history.   ROS:  Please see the history of present illness.   All other ROS reviewed and negative.     Physical Exam/Data:   Vitals:   05/06/18 2129 05/07/18 0538 05/07/18 0540 05/07/18 0850  BP: 104/65 114/73  110/77  Pulse:  66    Resp:  19    Temp:  97.6 F (36.4 C)    TempSrc:  Oral    SpO2:  97%    Weight:   77.3 kg   Height:        Intake/Output Summary (Last 24 hours) at 05/07/2018 1107 Last data filed at 05/07/2018 0731 Gross per 24 hour  Intake 905 ml  Output 4975 ml  Net -4070 ml   Filed Weights   05/05/18 0325 05/06/18 0441 05/07/18 0540  Weight: 77 kg 76.5 kg 77.3 kg   Body mass index is 25.91 kg/m.  General:  Well nourished, well developed, in no acute distress HEENT: normal Lymph: no adenopathy Neck: no JVD Endocrine:  No thryomegaly Vascular: No carotid bruits; FA pulses 2+ bilaterally without bruits  Cardiac:  normal S1, S2; RRR; no murmur  Lungs:  clear to auscultation bilaterally, no wheezing, rhonchi or rales  Abd: soft, nontender, no hepatomegaly  Ext: no edema Musculoskeletal:  No deformities, BUE and BLE strength normal and equal Skin: warm and dry  Neuro:  CNs 2-12 intact, no focal abnormalities noted Psych:  Normal affect   EKG:  The EKG  was personally reviewed and demonstrates: Normal sinus rhythm with LVH Telemetry:  Telemetry was personally reviewed and demonstrates: Normal sinus rhythm no significant ventricular ectopy.  Relevant CV Studies:  Echo 05/01/2018 LV EF: 20% -   25% Study Conclusions  - Left ventricle: The cavity size was severely dilated. There was   mild concentric hypertrophy. Systolic function was severely   reduced. The estimated ejection fraction was in the range of 20%   to 25%. Severe diffuse hypokinesis. There was an increased   relative contribution of atrial contraction to ventricular   filling. Doppler parameters are consistent with abnormal left   ventricular relaxation (grade 1 diastolic dysfunction). - Aortic valve: Trileaflet; mildly thickened, mildly calcified   leaflets. There was trivial regurgitation. - Mitral valve: There was mild regurgitation. - Left atrium: The atrium was moderately dilated. - Pulmonary arteries: PA peak pressure: 37 mm Hg (S).  Impressions:  - The right ventricular systolic pressure was increased consistent   with mild pulmonary hypertension.  Laboratory Data:  Chemistry Recent Labs  Lab 05/05/18 0604 05/06/18 0727 05/07/18 0557  NA 136 136 140  K 3.6 3.3* 3.6  CL 99 96* 99  CO2 29 28 30   GLUCOSE 100* 103* 109*  BUN 24* 38* 37*  CREATININE 1.44* 1.42* 1.67*  1.71*  CALCIUM 8.7* 9.0 9.0  GFRNONAA 51* 52* 43*  42*  GFRAA 60* >60 50*  48*  ANIONGAP 8 12 11     Recent Labs  Lab 04/30/18 1306  PROT 6.1*  ALBUMIN 3.0*  AST 20  ALT 17  ALKPHOS 53  BILITOT 0.5   Hematology Recent Labs  Lab 04/30/18 1306 05/05/18 0604  WBC 6.4 5.7  RBC 5.07 5.67  HGB 13.5 14.7  HCT 40.2 46.5  MCV 79.3 82.0  MCH 26.6 25.9*  MCHC 33.6 31.6  RDW 16.1* 15.5  PLT 229 266   Cardiac Enzymes Recent Labs  Lab 04/30/18 1306 04/30/18 2007 05/01/18 0223  TROPONINI 0.03* <0.03 <0.03   No results for input(s): TROPIPOC in the last 168 hours.   BNP Recent Labs  Lab 04/30/18 1306  BNP 448.5*    DDimer No results for input(s): DDIMER in the last 168 hours.  Radiology/Studies:  No results found.  Assessment and Plan:   1. Acute on chronic systolic heart failure:  -Weight is not accurate, his weight has steadily went from 160 pounds on arrival 270 pounds during this admission despite the fact that he has been diuresed 8 L.  -He appears to be euvolemic on physical exam.  Creatinine trended up to 1.6.  I would recommend switching back to Lasix p.o. 40 mg daily and spironolactone 25 mg daily tomorrow prior to discharge.  He will need 1 week basic metabolic panel.  Hold off on diuresis today  -Continue Imdur and hydralazine.  If he can stay away from cocaine for 3 months, will consider repeat echocardiogram.  Agree with previous assessment that no ischemic work-up is recommended at this time given questionable drug use.  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  - will consider coreg if he can be clean from cocaine per 3 month.  2. Polysubstance abuse: There is seems to be a lot of social and family issues going on.  He says despite his effort to quit, his wife and her girlfriend constantly smoke cocaine at home.  3. Hypertension: Blood pressure borderline, he has been having some dizziness.  He has likely reached euvolemic level.  Off of diuretic today, started on p.o. Lasix 40 mg daily along with 25 mg daily of spironolactone tomorrow prior to discharge.  4. Hyperlipidemia: LDL 113.  Total cholesterol, triglyceride and HDL normal.  If he is willing to proceed with follow-up, would recommend diet and exercise as initial step.  Hesitant to use statin drug if he is noncompliant with follow-up.      For questions or updates, please contact [place] Please consult [email or web address] for contact info under     Signed, Almyra Deforest, [place]  05/07/2018 11:07 AM   I have examined the patient and reviewed assessment and plan and discussed  with patient.  Agree with above as stated.  Significant issues with compliance and other social issues.  He is around people who use drugs.    I stressed the importance of compliance with meds to treat his heart failure.  At this time, I agree with holding off on cath since I am not sure we can count on him taking DAPT if he were to have PCI.    Once he has shown compliance, could reconsider.  He appears euvolemic at this point.  CONtinue to monitor renal function.  Beta blocker would be helpful if he remains cocaine free.    Will follow.   Larae Grooms

## 2018-05-07 NOTE — Significant Event (Addendum)
Rapid Response Event Note  Overview: Chest Pain and Shortness of Breath.  Initial Focused Assessment: Called by RN about having an acute onset of chest pain coupled with shortness of breath. Per RN, patient was asymptomatic and then had an acute onset of CP. RN placed patient on 4L oxygen via [place] and paged [facility] NP and called RR.  Upon arrival, patient was complaining of severe left substernal chest pain. Per patient his chest pain was 7/10. Patient was very short of breath, very anxious, shaking, and crying. SBP was in the 150s, HR in the 70s, 100% on 4L [place], and RR in the upper 20s (Pain ? Anxiety ?). EKG was done as well. I paged the [facility] NP, updated her and also paged the CARDS MD on call, CARDS MD is coming to see the patient. Chest pain subsided at 2130 on its own initially but came back after a few minutes and at 2135, he was given NTG SL 0.4mg  x 1. Pain was improved and it was now 1/10. Per patient, he this happens at home and has happened today but he did not tell the staff because he wanted to go home soon. VSS, normotensive and in SR.  Interventions: - STAT EKG - no acute changes - STAT CXR - stable cardiomegaly and mild central congestion - NTG SL 0.4mg  x 1 - CP improved after one dose - STAT Troponin (cycling) - pending - APAP x 1 - for HA (s/t NTG and left chest/rib cage discomfort - 1/10 pain, heat packs also applied).  Plan of Care: - follow up with Troponins  Event Summary: [place] [facility] NP paged and called back at 2132 and 2149 - CARDS MD paged and called back at 2136. MD came to bedside to assess patient.    at    Call Time 2118 Arrival Time 2120 End Time 2215 Zaccai Chavarin R

## 2018-05-07 NOTE — Progress Notes (Signed)
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  Admitted for management of acute on chronic systolic CHF in the context of medication noncompliance and?  Ongoing cocaine abuse (patient denies).    Assessment & Plan:   Principal Problem:   Acute on chronic systolic CHF (congestive heart failure) (HCC) Active Problems:   Cocaine abuse (HCC)   Benign essential HTN   CKD (chronic kidney disease) stage 3, GFR 30-59 ml/min (HCC)   Chronic respiratory failure with hypoxia (HCC)   Hypertensive urgency   Acute on chronic systolic CHF/nonischemic cardiomyopathy ECHO; Left ventricle: The cavity size was severely dilated. There was mild concentric hypertrophy. Systolic function was severely reduced. The estimated ejection fraction was in the range of 20%  to 25%. Severe diffuse hypokinesis. There was an increased relative contribution of atrial contraction to ventricular  filling. Doppler parameters are consistent with abnormal left  ventricular relaxation (grade 1 diastolic dysfunction). - Aortic valve:  Trileaflet; mildly thickened, mildly calcified leaflets. There was trivial regurgitation. - Mitral valve: There was mild regurgitation. - Left atrium: The atrium was moderately dilated. - Pulmonary arteries: PA peak pressure: 37 mm Hg (S). -weight up again, 166---167---169---168--170 probably not accurate  -Allergy to lisinopril. No ARB die to AKI.  Counseling provided.  -had stress test negative for reversible ischemia.  -Negative 9 L.  Hold lasix.  Cardiology consulted.   HTN;  Imdur, lasix.  No ACEI/ARB due to renal insufficiency.  AKI on Stage III chronic kidney disease May was 1.6, in June 1.22, presented with 1.44 and stable.  Cr stable. 1.4 --1.7 Related to diuresis. Hold lasix.    History of TIA/CVA: Resume aspirin and statins.  History of polysubstance abuse (tobacco and cocaine): UDS  Counseling provided.   Chronic respiratory failure with hypoxia.  ? Related to HF.  On oxygen supplementation.     DVT prophylaxis: Lovenox Code Status: full code.  Family Communication none Disposition Plan: home soon. Hopefully tomorrow. Need to repeat renal function in am   Consultants:   none   Procedures:  ECHO; - Left ventricle: The cavity size was severely dilated. There was   mild concentric hypertrophy. Systolic function was severely   reduced. The estimated ejection fraction was in the range of 20%   to 25%. Severe diffuse hypokinesis. There was an increased   relative contribution of atrial contraction to ventricular   filling. Doppler parameters are consistent with abnormal left   ventricular relaxation (grade 1 diastolic dysfunction). - Aortic valve: Trileaflet; mildly thickened, mildly calcified   leaflets. There was trivial regurgitation. - Mitral valve: There was mild regurgitation. - Left atrium: The atrium was moderately dilated.  - Pulmonary arteries: PA peak pressure: 37 mm Hg (S).   Antimicrobials:   none  Subjective: Still with dyspnea at  rest    Objective: Vitals:   05/06/18 2129 05/07/18 0538 05/07/18 0540 05/07/18 0850  BP: 104/65 114/73  110/77  Pulse:  66    Resp:  19    Temp:  97.6 F (36.4 C)    TempSrc:  Oral    SpO2:  97%    Weight:   77.3 kg   Height:        Intake/Output Summary (Last 24 hours) at 05/07/2018 1428 Last data filed at 05/07/2018 0731 Gross per 24 hour  Intake 665 ml  Output 3575 ml  Net -2910 ml   Filed Weights   05/05/18 0325 05/06/18 0441 05/07/18 0540  Weight: 77 kg 76.5 kg 77.3 kg    Examination:  General exam:NAD Respiratory system; CTA Cardiovascular system: S 1, S 2 RRR Gastrointestinal system: BS present, soft nt Central nervous system: Non focal.  Extremities:  No edema Skin: No rashes.    Data Reviewed: I have personally reviewed following labs and imaging studies  CBC: Recent Labs  Lab 05/05/18 0604  WBC 5.7  HGB 14.7  HCT 46.5  MCV 82.0  PLT 443   Basic Metabolic Panel: Recent Labs  Lab 05/03/18 0512 05/04/18 0504 05/05/18 0604 05/06/18 0727 05/07/18 0557  NA 139 139 136 136 140  K 3.5 3.6 3.6 3.3* 3.6  CL 102 104 99 96* 99  CO2 27 25 29 28 30   GLUCOSE 93 86 100* 103* 109*  BUN 19 18 24* 38* 37*  CREATININE 1.37* 1.27* 1.44* 1.42* 1.67*  1.71*  CALCIUM 8.6* 8.8* 8.7* 9.0 9.0   GFR: Estimated Creatinine Clearance: 44.4 mL/min (A) (by C-G formula based on SCr of 1.71 mg/dL (H)). Liver Function Tests: No results for input(s): AST, ALT, ALKPHOS, BILITOT, PROT, ALBUMIN in the last 168 hours. No results for input(s): LIPASE, AMYLASE in the last 168 hours. No results for input(s): AMMONIA in the last 168 hours. Coagulation Profile: No results for input(s): INR, PROTIME in the last 168 hours. Cardiac Enzymes: Recent Labs  Lab 04/30/18 2007 05/01/18 0223  TROPONINI <0.03 <0.03   BNP (last 3 results) No results for input(s): PROBNP in the last 8760 hours. HbA1C: No results for input(s): HGBA1C in the last 72 hours. CBG: Recent Labs  Lab  05/03/18 0736  GLUCAP 147*   Lipid Profile: No results for input(s): CHOL, HDL, LDLCALC, TRIG, CHOLHDL, LDLDIRECT in the last 72 hours. Thyroid Function Tests: No results for input(s): TSH, T4TOTAL, FREET4, T3FREE, THYROIDAB in the last 72 hours. Anemia Panel: No results for input(s): VITAMINB12, FOLATE, FERRITIN, TIBC, IRON, RETICCTPCT in the last 72 hours. Sepsis Labs: No results for input(s): PROCALCITON, LATICACIDVEN in the last 168 hours.  No results found for this or any previous visit (from the past 240 hour(s)).       Radiology Studies: No results found.      Scheduled Meds: . enoxaparin (LOVENOX) injection  40 mg Subcutaneous Q24H  . fluticasone  1 spray Each Nare Daily  . hydrALAZINE  25 mg Oral TID  . isosorbide mononitrate  30 mg Oral Daily  . pantoprazole  40 mg Oral BID AC  . sodium chloride flush  3 mL Intravenous Q12H  . tamsulosin  0.4 mg Oral QPC supper   Continuous Infusions: . sodium chloride       LOS: 7 days    Time spent: 35 minutes     Elmarie Shiley, MD [facility] Pager [phone]  If 7PM-7AM,  please contact night-coverage [email or web address] Password [email or web address] 05/07/2018, 2:28 PM

## 2018-05-08 DIAGNOSIS — J9611 Chronic respiratory failure with hypoxia: Secondary | ICD-10-CM

## 2018-05-08 LAB — BASIC METABOLIC PANEL
ANION GAP: 8 (ref 5–15)
BUN: 27 mg/dL — ABNORMAL HIGH (ref 6–20)
CO2: 27 mmol/L (ref 22–32)
Calcium: 8.7 mg/dL — ABNORMAL LOW (ref 8.9–10.3)
Chloride: 104 mmol/L (ref 98–111)
Creatinine, Ser: 1.51 mg/dL — ABNORMAL HIGH (ref 0.61–1.24)
GFR calc Af Amer: 56 mL/min — ABNORMAL LOW (ref >60)
GFR calc non Af Amer: 49 mL/min — ABNORMAL LOW (ref >60)
GLUCOSE: 90 mg/dL (ref 70–99)
POTASSIUM: 3.8 mmol/L (ref 3.5–5.1)
Sodium: 139 mmol/L (ref 135–145)

## 2018-05-08 LAB — TROPONIN I
Troponin I: <0.03 ng/mL (ref <0.03)
Troponin I: <0.03 ng/mL (ref <0.03)

## 2018-05-08 MED ORDER — FUROSEMIDE 40 MG PO TABS
40.0000 mg | ORAL_TABLET | Freq: Every day | ORAL | Status: DC
  Administered 2018-05-08: 40 mg via ORAL
  Filled 2018-05-08: qty 1

## 2018-05-08 MED ORDER — SPIRONOLACTONE 25 MG PO TABS: 25.0000 mg | ORAL_TABLET | Freq: Every day | ORAL | 0 refills | Status: DC

## 2018-05-08 MED ORDER — FUROSEMIDE 40 MG PO TABS: 40.0000 mg | ORAL_TABLET | Freq: Every day | ORAL | 0 refills | Status: DC

## 2018-05-08 NOTE — Progress Notes (Signed)
Pt called out for C/O left substernal chest pain, rating his pain 10/10. Pt was immediately placed on 4L02 per protocol, Rapid Response RN was notified, and provider on call was paged. EKG was done which was not different from the previous EKG results. The pain subsided to 7/10 after the oxygen was initiated. Pt verbalized that the pain is sharp, and severe unlike the previous pain experiences he has had. Nitro SL x1 dose was given at 2135, with some relief.  Pt later denies any further discomfort and pain after couple of minutes. Currently, pt is carrying on conversation, MD on call has been updated by the RR nurse and  Cardiologist was the bedside and was updated also.

## 2018-05-08 NOTE — Progress Notes (Addendum)
Progress Note  Patient Name: Terrence Garcia Date of Encounter: 05/08/2018  Primary Cardiologist: Ena Dawley, MD  Subjective   Pain is better, + Lt lat chest pain during the night, relief with NTG.  No SOB neg troponins  Inpatient Medications    Scheduled Meds: . enoxaparin (LOVENOX) injection  40 mg Subcutaneous Q24H  . fluticasone  1 spray Each Nare Daily  . hydrALAZINE  25 mg Oral TID  . isosorbide mononitrate  30 mg Oral Daily  . pantoprazole  40 mg Oral BID AC  . sodium chloride flush  3 mL Intravenous Q12H  . tamsulosin  0.4 mg Oral QPC supper   Continuous Infusions: . sodium chloride    . nitroGLYCERIN     PRN Meds: sodium chloride, acetaminophen, ondansetron (ZOFRAN) IV, sodium chloride flush   Vital Signs    Vitals:   05/07/18 2013 05/07/18 2124 05/07/18 2132 05/08/18 0526  BP: 105/70 (!) 154/97 (!) 135/91 (!) 95/56  Pulse: 75 77 77 70  Resp:    18  Temp:    97.9 F (36.6 C)  TempSrc:    Oral  SpO2: 98%  100% 98%  Weight:    79.8 kg  Height:        Intake/Output Summary (Last 24 hours) at 05/08/2018 0954 Last data filed at 05/08/2018 8250 Gross per 24 hour  Intake 480 ml  Output 1037 ml  Net -557 ml   Filed Weights   05/06/18 0441 05/07/18 0540 05/08/18 0526  Weight: 76.5 kg 77.3 kg 79.8 kg   Telemetry    SR - Personally Reviewed  ECG    SR with abnormal Lateral T wave increased from admit - Personally Reviewed  Physical Exam   GEN: No acute distress.   Neck: No JVD Cardiac: RRR, no murmurs, rubs, or gallops.  Respiratory: Clear to auscultation bilaterally. GI: Soft, nontender, non-distended  MS: No edema; No deformity. Neuro:  Nonfocal  Psych: Normal affect   Labs    Chemistry Recent Labs  Lab 05/05/18 0604 05/06/18 0727 05/07/18 0557  NA 136 136 140  K 3.6 3.3* 3.6  CL 99 96* 99  CO2 29 28 30   GLUCOSE 100* 103* 109*  BUN 24* 38* 37*  CREATININE 1.44* 1.42* 1.67*  1.71*  CALCIUM 8.7* 9.0 9.0  GFRNONAA 51* 52*  43*  42*  GFRAA 60* >60 50*  48*  ANIONGAP 8 12 11     Hematology Recent Labs  Lab 05/05/18 0604  WBC 5.7  RBC 5.67  HGB 14.7  HCT 46.5  MCV 82.0  MCH 25.9*  MCHC 31.6  RDW 15.5  PLT 266    Cardiac Enzymes Recent Labs  Lab 05/07/18 2159 05/08/18 0338  TROPONINI <0.03 <0.03   No results for input(s): TROPIPOC in the last 168 hours.   BNPNo results for input(s): BNP, PROBNP in the last 168 hours.   DDimer No results for input(s): DDIMER in the last 168 hours.   Radiology    Dg Chest Port 1 View  Result Date: 05/07/2018 CLINICAL DATA:  Chest pain and short of breath EXAM: PORTABLE CHEST 1 VIEW COMPARISON:  04/30/2018, 01/02/2018 radiograph and CT FINDINGS: Similar appearance of cardiomegaly and mild central congestion. No focal airspace disease or effusion. No pneumothorax. IMPRESSION: No active disease.  Stable cardiomegaly and mild central congestion Electronically Signed   By: Donavan Foil M.D.   On: 05/07/2018 22:18    Cardiac Studies   TTE 05/01/18 Study Conclusions  - Left ventricle:  The cavity size was severely dilated. There was   mild concentric hypertrophy. Systolic function was severely   reduced. The estimated ejection fraction was in the range of 20%   to 25%. Severe diffuse hypokinesis. There was an increased   relative contribution of atrial contraction to ventricular   filling. Doppler parameters are consistent with abnormal left   ventricular relaxation (grade 1 diastolic dysfunction). - Aortic valve: Trileaflet; mildly thickened, mildly calcified   leaflets. There was trivial regurgitation. - Mitral valve: There was mild regurgitation. - Left atrium: The atrium was moderately dilated. - Pulmonary arteries: PA peak pressure: 37 mm Hg (S).  Impressions:  - The right ventricular systolic pressure was increased consistent   with mild pulmonary hypertension.   TTE 12/2017 Study Conclusions  - Left ventricle: The cavity size was mildly  dilated. Wall   thickness was increased in a pattern of mild LVH. Systolic   function was severely reduced. The estimated ejection fraction   was 15%. Diffuse hypokinesis. Doppler parameters are consistent   with high ventricular filling pressure. - Aortic valve: There was trivial regurgitation. - Aortic root: The aortic root was mildly dilated. - Mitral valve: There was mild regurgitation. - Left atrium: The atrium was moderately dilated. - Pericardium, extracardiac: A small pericardial effusion was   identified.  Impressions:  - Severe global reduction in LV systolic function; elevated LV   filling pressure; mild LVH and LVE; trace AI; mildly dilated   aortic root; mild MR; moderate LAE; small pericardial effusion   with mild RA collapse.  Patient Profile     62 y.o. male with a hx of HTN, HLD, TIA/CVA, intracranial hypertension, and history of polysubstance abuse and now acute on chronic systolic HF.  He had been out of his medications.    Assessment & Plan    Acute on chronic systolic and diastolic HF  -- Neg 8338 since admit and wt has increased despite this.  Do not believe wts are correct.   --Cr was elevated and lasix held, todays BMP not yet back.  If Cr improved would resume po lasix and spironolactone , continue hydralazine and imdur.   --troponins neg  --CXR today No active disease.  Stable cardiomegaly and mild central congestion  Chest pain last evening relief with NTG --plan had been for no cath neg troponins, ? Cardiac CTA  Defer to Dr. Meda Coffee  CM --with his non compliance due to finances no cardiac cath to decide if ischemic.  Not sure he would take meds.   His EF has improved with medication.  Polysubstance abuse, with numerous family and social issues.     HTN low today at 95/56   CKD 3  For questions or updates, please contact [place] Please consult [email or web address] for contact info under     Signed, Cecilie Kicks, NP  05/08/2018, 9:54 AM    The  patient was seen, examined and discussed with Cecilie Kicks, NP and I agree with the above.   62 y.o. male with a hx of HTN, HLD, TIA/CVA, intracranial hypertension, and history of polysubstance abuse who is being seen today for the evaluation of acute on chronic systolic heart failure, recurrent chest pains relived by NTG. The patient has a new diagnosis of low LVEF 20-25%, he was recently admitted with CHF, however didn't follow as outpatient and didn't take his meds. Readmitted with acute on chronic combined CHF, actively taking cocaine. He was diuresed 9 L this hospitalization, Crea up 1.4--> 1.6, he  is not a cath candidate sec to acute on chronic kidney failure and non-compliance. Patients chest pains are very atypical - at rest, sharp, resolved when he lifts his arm.  I would schedule a stress test as outpatient, and consider cath if large ischemia, otherwise aggressive medical therapy. Continue hydralazine, Imdur, aspirin, ECG shows SR, LVH , negative T waves in the lateral leads unchanged from June 2019. He can be discharged today, we will arrange for a follow up, he prefers [place] location. He can be discharge today.  Ena Dawley, MD 05/08/2018

## 2018-05-08 NOTE — Plan of Care (Signed)
  Problem: Education: Goal: Knowledge of General Education information will improve Description: Including pain rating scale, medication(s)/side effects and non-pharmacologic comfort measures Outcome: Progressing   Problem: Health Behavior/Discharge Planning: Goal: Ability to manage health-related needs will improve Outcome: Progressing   Problem: Clinical Measurements: Goal: Respiratory complications will improve Outcome: Progressing   Problem: Pain Managment: Goal: General experience of comfort will improve Outcome: Progressing   Problem: Safety: Goal: Ability to remain free from injury will improve Outcome: Progressing   

## 2018-05-08 NOTE — Discharge Summary (Signed)
Physician Discharge Summary  Terrence Garcia [id number]:[id number] DOB: 09/19/1955 DOA: 04/30/2018  PCP: Luetta Nutting, DO  Admit date: 04/30/2018 Discharge date: 05/08/2018  Admitted From: Home Disposition:  Home   Recommendations for Outpatient Follow-up:  1. Follow up with PCP in 1-2 weeks 2. Please obtain BMP/CBC in one week 3. Follow up with cardiology for further care of heart failure.    Discharge Condition: Stable.  CODE STATUS: full code.  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 Admitted for management of acute on chronic systolic CHF in the context of medication noncompliance and? Ongoing cocaine abuse (patient denies).    Assessment & Plan:   Principal Problem:   Acute on chronic systolic CHF (congestive heart failure) (HCC) Active Problems:   Cocaine abuse (HCC)   Benign essential HTN   CKD (chronic kidney disease) stage 3, GFR 30-59 ml/min (HCC)   Chronic respiratory failure with hypoxia (HCC)   Hypertensive urgency   Acute on chronic systolic CHF/nonischemic cardiomyopathy ECHO; Left ventricle: The cavity size was  severely dilated. There was mild concentric hypertrophy. Systolic function was severely reduced. The estimated ejection fraction was in the range of 20% to 25%. Severe diffuse hypokinesis. There was an increased relative contribution of atrial contraction to ventricular filling. Doppler parameters are consistent with abnormal left ventricular relaxation (grade 1 diastolic dysfunction). - Aortic valve: Trileaflet; mildly thickened, mildly calcified leaflets. There was trivial regurgitation. - Mitral valve: There was mild regurgitation. - Left atrium: The atrium was moderately dilated. - Pulmonary arteries: PA peak pressure: 37 mm Hg (S). -weight up again, 166---167---169---168--170 probably not accurate  -Allergy to lisinopril. No ARB die to AKI.  Counseling provided.  -had stress test negative for reversible ischemia.  -Negative 9 L.  -renal function improved. Resume lasix and spironolactone.  Cardiology consulted.  -had episode of chest pain last night that was atypical. Resolved. Plan to follow up with cardiology for stress test and management of Cardiomyopathy.   HTN;  Imdur, lasix.  No ACEI/ARB due to renal insufficiency.  AKI on Stage III chronic kidney disease May was 1.6, in June 1.22, presented with 1.44 and stable.  Cr peak to 1.7---decreased to 1.5 today.  Needs close follow up to monitor renal function on diuretics.  Resume lasix and spironolactone.    History of TIA/CVA:Resume aspirin and statins.  History of polysubstance abuse (tobacco and cocaine):UDS  Counseling provided.   Chronic respiratory failure with hypoxia.  ? Related to HF.  On oxygen supplementation.     Discharge Diagnoses:  Principal Problem:   Acute on chronic systolic CHF (congestive heart failure) (HCC) Active Problems:   Cocaine abuse (HCC)   Benign essential HTN   CKD (chronic kidney disease) stage 3, GFR 30-59 ml/min (HCC)   Chronic respiratory failure with hypoxia (HCC)    Hypertensive urgency  Discharge Instructions  Discharge Instructions    Diet - low sodium heart healthy   Complete by:  As directed    Increase activity slowly   Complete by:  As directed      Allergies as of 05/08/2018      Reactions   Lisinopril Swelling   Iodinated Diagnostic Agents Nausea And Vomiting   Penicillins Other (See Comments)   Has patient had a PCN reaction causing immediate rash, facial/tongue/throat swelling, SOB or lightheadedness with hypotension: no Has patient had a PCN reaction causing severe rash involving mucus membranes or skin necrosis: no Has patient had a PCN reaction that required hospitalization: no Has patient had a PCN reaction occurring within the last 10 years: No If all of the above answers are "NO", then may proceed with Cephalosporin use. "pt states does nothing for him"      Medication List    STOP taking these medications   carvedilol 3.125 MG tablet Commonly known as:  COREG     TAKE these medications   furosemide 40 MG tablet Commonly known as:  LASIX Take 1 tablet (40 mg total) by mouth daily.   hydrALAZINE 25 MG tablet Commonly known as:  APRESOLINE Take 1 tablet (25 mg total) by mouth 3 (three) times daily.   isosorbide mononitrate 30 MG 24 hr tablet Commonly known as:  IMDUR Take 1 tablet (30 mg total) by mouth daily.   omeprazole 40 MG capsule Commonly known as:  PRILOSEC Take 1 capsule (40 mg total) by mouth 2 (two) times daily.   spironolactone 25 MG tablet Commonly known as:  ALDACTONE Take 1 tablet (25 mg total) by mouth daily. Start taking on:  05/09/2018   tamsulosin 0.4 MG Caps capsule Commonly known as:  FLOMAX Take 1 capsule (0.4 mg total) by mouth daily after supper.      Follow-up Information    Talbert Cage, NP Follow up on 06/05/2018.   Why:  at 10:00 AM for cardiology follow up Contact information: [place] [address] [phone]          Allergies  Allergen  Reactions  . Lisinopril Swelling  . Iodinated Diagnostic Agents Nausea And Vomiting  . Penicillins Other (See Comments)    Has patient had a PCN reaction causing immediate rash, facial/tongue/throat swelling, SOB or lightheadedness with hypotension: no Has patient had a PCN reaction causing severe rash involving mucus membranes or skin necrosis: no Has patient had a PCN reaction that required hospitalization: no Has patient had a PCN reaction occurring within the last 10 years: No If all of the above answers are "NO", then may proceed with Cephalosporin use. "pt states does nothing for him"    Consultations: Cardiology   Procedures/Studies: Dg Chest 2 View  Result Date: 04/30/2018 CLINICAL DATA:  Shortness of breath. EXAM: CHEST - 2 VIEW COMPARISON:  Radiographs of May 28th 2019. FINDINGS: The heart size and mediastinal contours are within normal limits. No pneumothorax or pleural effusion is noted. Stable bibasilar subsegmental atelectasis or scarring. The visualized skeletal structures are unremarkable. IMPRESSION: Stable bibasilar subsegmental atelectasis or scarring. Electronically Signed   By: Marijo Conception, M.D.   On: 04/30/2018 12:58   Dg Chest Port 1 View  Result Date: 05/07/2018 CLINICAL DATA:  Chest pain and short of breath EXAM: PORTABLE CHEST 1 VIEW COMPARISON:  04/30/2018, 01/02/2018 radiograph and CT FINDINGS: Similar appearance of cardiomegaly and mild central congestion. No focal airspace disease or effusion. No pneumothorax. IMPRESSION:  No active disease.  Stable cardiomegaly and mild central congestion Electronically Signed   By: Donavan Foil M.D.   On: 05/07/2018 22:18      Subjective: Chest pain free. Had chest pain last night, worse with movement of left arm.  Denies dyspnea.   Discharge Exam: Vitals:   05/08/18 0526 05/08/18 0959  BP: (!) 95/56 132/84  Pulse: 70   Resp: 18   Temp: 97.9 F (36.6 C)   SpO2: 98%    Vitals:   05/07/18 2124 05/07/18 2132  05/08/18 0526 05/08/18 0959  BP: (!) 154/97 (!) 135/91 (!) 95/56 132/84  Pulse: 77 77 70   Resp:   18   Temp:   97.9 F (36.6 C)   TempSrc:   Oral   SpO2:  100% 98%   Weight:   79.8 kg   Height:        General: Pt is alert, awake, not in acute distress Cardiovascular: RRR, S1/S2 +, no rubs, no gallops Respiratory: CTA bilaterally, no wheezing, no rhonchi Abdominal: Soft, NT, ND, bowel sounds + Extremities: no edema, no cyanosis    The results of significant diagnostics from this hospitalization (including imaging, microbiology, ancillary and laboratory) are listed below for reference.     Microbiology: No results found for this or any previous visit (from the past 240 hour(s)).   Labs: BNP (last 3 results) Recent Labs    12/31/17 0644 04/30/18 1306  BNP 928.0* 528.4*   Basic Metabolic Panel: Recent Labs  Lab 05/04/18 0504 05/05/18 0604 05/06/18 0727 05/07/18 0557 05/08/18 0956  NA 139 136 136 140 139  K 3.6 3.6 3.3* 3.6 3.8  CL 104 99 96* 99 104  CO2 25 29 28 30 27   GLUCOSE 86 100* 103* 109* 90  BUN 18 24* 38* 37* 27*  CREATININE 1.27* 1.44* 1.42* 1.67*  1.71* 1.51*  CALCIUM 8.8* 8.7* 9.0 9.0 8.7*   Liver Function Tests: No results for input(s): AST, ALT, ALKPHOS, BILITOT, PROT, ALBUMIN in the last 168 hours. No results for input(s): LIPASE, AMYLASE in the last 168 hours. No results for input(s): AMMONIA in the last 168 hours. CBC: Recent Labs  Lab 05/05/18 0604  WBC 5.7  HGB 14.7  HCT 46.5  MCV 82.0  PLT 266   Cardiac Enzymes: Recent Labs  Lab 05/07/18 2159 05/08/18 0338 05/08/18 0956  TROPONINI <0.03 <0.03 <0.03   BNP: Invalid input(s): POCBNP CBG: Recent Labs  Lab 05/03/18 0736  GLUCAP 147*   D-Dimer No results for input(s): DDIMER in the last 72 hours. Hgb A1c No results for input(s): HGBA1C in the last 72 hours. Lipid Profile No results for input(s): CHOL, HDL, LDLCALC, TRIG, CHOLHDL, LDLDIRECT in the last 72 hours. Thyroid  function studies No results for input(s): TSH, T4TOTAL, T3FREE, THYROIDAB in the last 72 hours.  Invalid input(s): FREET3 Anemia work up No results for input(s): VITAMINB12, FOLATE, FERRITIN, TIBC, IRON, RETICCTPCT in the last 72 hours. Urinalysis    Component Value Date/Time   COLORURINE YELLOW 04/30/2018 [place] 04/30/2018 1415   LABSPEC 1.015 04/30/2018 1415   PHURINE 7.5 04/30/2018 1415   GLUCOSEU NEGATIVE 04/30/2018 1415   HGBUR NEGATIVE 04/30/2018 1415   BILIRUBINUR NEGATIVE 04/30/2018 1415   BILIRUBINUR neg 01/08/2018 1105   KETONESUR NEGATIVE 04/30/2018 1415   PROTEINUR NEGATIVE 04/30/2018 1415   UROBILINOGEN 0.2 01/08/2018 1105   NITRITE NEGATIVE 04/30/2018 1415   LEUKOCYTESUR NEGATIVE 04/30/2018 1415   Sepsis Labs Invalid input(s): PROCALCITONIN,  WBC,  LACTICIDVEN Microbiology No results found for this or any previous visit (from the past 240 hour(s)).   Time coordinating discharge; 35 minutes.   SIGNED:   Elmarie Shiley, MD  [facility] 05/08/2018, 12:16 PM Pager [phone]  If 7PM-7AM, please contact night-coverage [email or web address] Password [email or web address]

## 2018-05-08 NOTE — Care Management Important Message (Signed)
Important Message  Patient Details  Name: Terrence Garcia MRN: [id number] Date of Birth: 1956-07-28   Medicare Important Message Given:  Yes    Arthur Speagle P [place] 05/08/2018, 1:22 PM

## 2018-05-08 NOTE — Progress Notes (Signed)
Patient ready for discharge, cardiac monitor removed, IV removed.

## 2018-05-09 ENCOUNTER — Encounter: Payer: Self-pay | Admitting: Family Medicine

## 2018-05-09 ENCOUNTER — Encounter: Payer: Self-pay | Admitting: Gastroenterology

## 2018-05-09 ENCOUNTER — Ambulatory Visit (INDEPENDENT_AMBULATORY_CARE_PROVIDER_SITE_OTHER): Payer: Medicare Other | Admitting: Family Medicine

## 2018-05-09 VITALS — BP 138/88 | HR 78 | Temp 98.5°F | Ht 68.0 in | Wt 182.0 lb

## 2018-05-09 DIAGNOSIS — I10 Essential (primary) hypertension: Secondary | ICD-10-CM | POA: Diagnosis not present

## 2018-05-09 DIAGNOSIS — F1410 Cocaine abuse, uncomplicated: Secondary | ICD-10-CM

## 2018-05-09 DIAGNOSIS — I5023 Acute on chronic systolic (congestive) heart failure: Secondary | ICD-10-CM

## 2018-05-09 DIAGNOSIS — N183 Chronic kidney disease, stage 3 unspecified: Secondary | ICD-10-CM

## 2018-05-09 DIAGNOSIS — R159 Full incontinence of feces: Secondary | ICD-10-CM | POA: Diagnosis not present

## 2018-05-09 DIAGNOSIS — Z1211 Encounter for screening for malignant neoplasm of colon: Secondary | ICD-10-CM

## 2018-05-09 DIAGNOSIS — Z23 Encounter for immunization: Secondary | ICD-10-CM

## 2018-05-09 LAB — CBC
HEMATOCRIT: 41 % (ref 39.0–52.0)
Hemoglobin: 13.2 g/dL (ref 13.0–17.0)
MCHC: 32.1 g/dL (ref 30.0–36.0)
MCV: 80.8 fl (ref 78.0–100.0)
PLATELETS: 232 10*3/uL (ref 150.0–400.0)
RBC: 5.07 Mil/uL (ref 4.22–5.81)
RDW: 15 % (ref 11.5–15.5)
WBC: 5.1 10*3/uL (ref 4.0–10.5)

## 2018-05-09 LAB — BASIC METABOLIC PANEL
BUN: 22 mg/dL (ref 6–23)
CHLORIDE: 104 meq/L (ref 96–112)
CO2: 31 meq/L (ref 19–32)
Calcium: 8.9 mg/dL (ref 8.4–10.5)
Creatinine, Ser: 1.22 mg/dL (ref 0.40–1.50)
GFR: 77.87 mL/min (ref >60.00)
Glucose, Bld: 101 mg/dL — ABNORMAL HIGH (ref 70–99)
POTASSIUM: 4.1 meq/L (ref 3.5–5.1)
SODIUM: 140 meq/L (ref 135–145)

## 2018-05-09 MED ORDER — OMEPRAZOLE 40 MG PO CPDR: 40.0000 mg | DELAYED_RELEASE_CAPSULE | Freq: Two times a day (BID) | ORAL | 1 refills | Status: DC

## 2018-05-09 MED ORDER — FUROSEMIDE 40 MG PO TABS: 40.0000 mg | ORAL_TABLET | Freq: Every day | ORAL | 1 refills | Status: DC

## 2018-05-09 MED ORDER — TAMSULOSIN HCL 0.4 MG PO CAPS: 0.4000 mg | ORAL_CAPSULE | Freq: Every day | ORAL | 1 refills | Status: DC

## 2018-05-09 MED ORDER — ISOSORBIDE MONONITRATE ER 30 MG PO TB24: 30.0000 mg | ORAL_TABLET | Freq: Every day | ORAL | 1 refills | Status: DC

## 2018-05-09 MED ORDER — SPIRONOLACTONE 25 MG PO TABS: 25.0000 mg | ORAL_TABLET | Freq: Every day | ORAL | 1 refills | Status: DC

## 2018-05-09 MED ORDER — HYDRALAZINE HCL 25 MG PO TABS: 25.0000 mg | ORAL_TABLET | Freq: Three times a day (TID) | ORAL | 1 refills | Status: DC

## 2018-05-09 NOTE — Assessment & Plan Note (Signed)
Update renal function.  

## 2018-05-09 NOTE — Assessment & Plan Note (Signed)
BP is fairly well controlled today, continue current medication.

## 2018-05-09 NOTE — Assessment & Plan Note (Addendum)
Relapsed.  Recent UDS positive, however he denies use. -Stressed importance of abstaining from crack cocaine use.  -Beta blocker stopped in hospital for ongoing use of cocaine.

## 2018-05-09 NOTE — Assessment & Plan Note (Signed)
-  Recent admission for CHF exacerbation, doing well currently.   -He will continue current medications, refills sent to preferred pharmacy.  -Recommend 1500mg  sodium each day -Recommend daily walking, he will ask cardiology about cardiac rehab program.  -Check weights at home each day.  -Abstain from cocaine/marijuana/tobacco products.

## 2018-05-09 NOTE — Progress Notes (Signed)
Terrence Garcia - 62 y.o. male MRN [id number]  Date of birth: [birth year]  Subjective Chief Complaint  Patient presents with  . Hospitalization Follow-up    HPI Terrence Garcia is a 62 y.o. male with history of systolic chf, htn, cocaine abuse, nicotine abuse here today for hospital f/u.  Discharged from hospital yesterday for acute on chronic exacerbation of CHF.  Reports being out of medication prior to admission.  Reports that he is feeling much better today.  Has f/u scheduled with cardiology in [facility] for 10/29.  He did test positive for cocaine while in the hospital.  Denies recent use.  He does admit to marijuana and tobacco use but tells me that he is done with these since getting out of the hospital.  He remains dependent on 2L O2 chronically.  He is out of several medications and needs renewals.  Paper rx was given at hospital but hasn't gotten these filled.  He denies chest pain, increased dyspnea, cough, dizziness, edema.   He also reports continued fecal incontinence as well.  Denies pain with BM, constipation or diarrhea.  Wishes to have referral re-entered for GI.    ROS:  A comprehensive ROS was completed and negative except as noted per HPI  Allergies  Allergen Reactions  . Lisinopril Swelling  . Iodinated Diagnostic Agents Nausea And Vomiting  . Penicillins Other (See Comments)    Has patient had a PCN reaction causing immediate rash, facial/tongue/throat swelling, SOB or lightheadedness with hypotension: no Has patient had a PCN reaction causing severe rash involving mucus membranes or skin necrosis: no Has patient had a PCN reaction that required hospitalization: no Has patient had a PCN reaction occurring within the last 10 years: No If all of the above answers are "NO", then may proceed with Cephalosporin use. "pt states does nothing for him"    Past Medical History:  Diagnosis Date  . CHF (congestive heart failure) ([place])   . Chronic back pain   . Dyspnea   .  Gastric ulcer   . Hypertension   . Prostate enlargement   . Stroke [facility])     Past Surgical History:  Procedure Laterality Date  . BACK SURGERY      Social History   Socioeconomic History  . Marital status: Married    Spouse name: Not on file  . Number of children: Not on file  . Years of education: Not on file  . Highest education level: Not on file  Occupational History  . Not on file  Social Needs  . Financial resource strain: Not on file  . Food insecurity:    Worry: Not on file    Inability: Not on file  . Transportation needs:    Medical: Not on file    Non-medical: Not on file  Tobacco Use  . Smoking status: Former Smoker    Last attempt to quit: 01/01/2018    Years since quitting: 0.3  . Smokeless tobacco: Never Used  Substance and Sexual Activity  . Alcohol use: No  . Drug use: Yes    Types: Cocaine    Comment: states sober for 14 years and used crack two days ago   . Sexual activity: Not on file  Lifestyle  . Physical activity:    Days per week: Not on file    Minutes per session: Not on file  . Stress: Not on file  Relationships  . Social connections:    Talks on phone: Not on file  Gets together: Not on file    Attends religious service: Not on file    Active member of club or organization: Not on file    Attends meetings of clubs or organizations: Not on file    Relationship status: Not on file  Other Topics Concern  . Not on file  Social History Narrative  . Not on file    No family history on file.  Health Maintenance  Topic Date Due  . TETANUS/TDAP  02/16/1977  . COLONOSCOPY  02/17/2008  . INFLUENZA VACCINE  03/08/2018  . Hepatitis C Screening  Completed  . HIV Screening  Completed    ----------------------------------------------------------------------------------------------------------------------------------------------------------------------------------------------------------------- Physical Exam BP 138/88 (BP Location:  Left Arm, Patient Position: Sitting, Cuff Size: Normal)   Pulse 78   Temp 98.5 F (36.9 C) (Oral)   Ht 5\' 8"  (1.727 m)   Wt 182 lb (82.6 kg)   SpO2 98%   BMI 27.67 kg/m   Physical Exam  Constitutional: He is oriented to person, place, and time. He appears well-nourished. No distress.  Wearing O2 via nasal cannula   HENT:  Head: Normocephalic and atraumatic.  Mouth/Throat: Oropharynx is clear and moist.  Eyes: No scleral icterus.  Neck: Neck supple. No thyromegaly present.  Cardiovascular: Normal rate, regular rhythm and normal heart sounds.  Pulmonary/Chest: Effort normal and breath sounds normal.  Abdominal: Soft. Bowel sounds are normal. He exhibits no distension. There is no tenderness.  Musculoskeletal: He exhibits no edema.  Neurological: He is alert and oriented to person, place, and time.  Skin: Skin is warm and dry. No rash noted.  Psychiatric: He has a normal mood and affect. His behavior is normal.    ------------------------------------------------------------------------------------------------------------------------------------------------------------------------------------------------------------------- Assessment and Plan  Acute on chronic systolic CHF (congestive heart failure) ([place]) -Recent admission for CHF exacerbation, doing well currently.   -He will continue current medications, refills sent to preferred pharmacy.  -Recommend 1500mg  sodium each day -Recommend daily walking, he will ask cardiology about cardiac rehab program.  -Check weights at home each day.  -Abstain from cocaine/marijuana/tobacco products.   Benign essential HTN BP is fairly well controlled today, continue current medication.   Cocaine abuse ([place]) Recent UDS positive, however he denies use. -Stressed importance of abstaining from crack cocaine use.  -Beta blocker stopped in hospital for ongoing use of cocaine.   Incontinence of feces Referral placed to GI again.   CKD  (chronic kidney disease) stage 3, GFR 30-59 ml/min (HCC) Update renal function.

## 2018-05-09 NOTE — Patient Instructions (Signed)
-Keep appointment with Cardiology in [place] about cardiac rehab program.  -Continue current medications -Weight yourself daily.  If you notice and increase of more than 4-5 lbs in a 24 hour period please call.  -No more than 1500mg  of sodium each day.  -Remain quit from smoking.   I will see you back in 2 months.    Heart Failure Heart failure means your heart has trouble pumping blood. This makes it hard for your body to work well. Heart failure is usually a long-term (chronic) condition. You must take good care of yourself and follow your doctor's treatment plan. Follow these instructions at home:  Take your heart medicine as told by your doctor. ? Do not stop taking medicine unless your doctor tells you to. ? Do not skip any dose of medicine. ? Refill your medicines before they run out. ? Take other medicines only as told by your doctor or pharmacist.  Stay active if told by your doctor. The elderly and people with severe heart failure should talk with a doctor about physical activity.  Eat heart-healthy foods. Choose foods that are without trans fat and are low in saturated fat, cholesterol, and salt (sodium). This includes fresh or frozen fruits and vegetables, fish, lean meats, fat-free or low-fat dairy foods, whole grains, and high-fiber foods. Lentils and dried peas and beans (legumes) are also good choices.  Limit salt if told by your doctor.  Cook in a healthy way. Roast, grill, broil, bake, poach, steam, or stir-fry foods.  Limit fluids as told by your doctor.  Weigh yourself every morning. Do this after you pee (urinate) and before you eat breakfast. Write down your weight to give to your doctor.  Take your blood pressure and write it down if your doctor tells you to.  Ask your doctor how to check your pulse. Check your pulse as told.  Lose weight if told by your doctor.  Stop smoking or chewing tobacco. Do not use gum or patches that help you quit without  your doctor's approval.  Schedule and go to doctor visits as told.  Nonpregnant women should have no more than 1 drink a day. Men should have no more than 2 drinks a day. Talk to your doctor about drinking alcohol.  Stop illegal drug use.  Stay current with shots (immunizations).  Manage your health conditions as told by your doctor.  Learn to manage your stress.  Rest when you are tired.  If it is really hot outside: ? Avoid intense activities. ? Use air conditioning or fans, or get in a cooler place. ? Avoid caffeine and alcohol. ? Wear loose-fitting, lightweight, and light-colored clothing.  If it is really cold outside: ? Avoid intense activities. ? Layer your clothing. ? Wear mittens or gloves, a hat, and a scarf when going outside. ? Avoid alcohol.  Learn about heart failure and get support as needed.  Get help to maintain or improve your quality of life and your ability to care for yourself as needed. Contact a doctor if:  You gain weight quickly.  You are more short of breath than usual.  You cannot do your normal activities.  You tire easily.  You cough more than normal, especially with activity.  You have any or more puffiness (swelling) in areas such as your hands, feet, ankles, or belly (abdomen).  You cannot sleep because it is hard to breathe.  You feel like your heart is beating fast (palpitations).  You get dizzy or  light-headed when you stand up. Get help right away if:  You have trouble breathing.  There is a change in mental status, such as becoming less alert or not being able to focus.  You have chest pain or discomfort.  You faint. This information is not intended to replace advice given to you by your health care provider. Make sure you discuss any questions you have with your health care provider. Document Released: 05/03/2008 Document Revised: 12/31/2015 Document Reviewed: 09/10/2012 Elsevier Interactive Patient Education  2017  [employer].

## 2018-05-09 NOTE — Assessment & Plan Note (Signed)
Referral placed to GI again.

## 2018-05-14 ENCOUNTER — Other Ambulatory Visit: Payer: Self-pay | Admitting: Emergency Medicine

## 2018-05-14 ENCOUNTER — Telehealth: Payer: Self-pay | Admitting: Family Medicine

## 2018-05-14 MED ORDER — OMEPRAZOLE 40 MG PO CPDR: 40.0000 mg | DELAYED_RELEASE_CAPSULE | Freq: Two times a day (BID) | ORAL | 1 refills | Status: AC

## 2018-05-14 MED ORDER — ISOSORBIDE MONONITRATE ER 30 MG PO TB24: 30.0000 mg | ORAL_TABLET | Freq: Every day | ORAL | 1 refills | Status: DC

## 2018-05-14 MED ORDER — FUROSEMIDE 40 MG PO TABS: 40.0000 mg | ORAL_TABLET | Freq: Every day | ORAL | 1 refills | Status: DC

## 2018-05-14 MED ORDER — SPIRONOLACTONE 25 MG PO TABS: 25.0000 mg | ORAL_TABLET | Freq: Every day | ORAL | 1 refills | Status: DC

## 2018-05-14 MED ORDER — HYDRALAZINE HCL 25 MG PO TABS: 25.0000 mg | ORAL_TABLET | Freq: Three times a day (TID) | ORAL | 1 refills | Status: DC

## 2018-05-14 MED ORDER — TAMSULOSIN HCL 0.4 MG PO CAPS: 0.4000 mg | ORAL_CAPSULE | Freq: Every day | ORAL | 1 refills | Status: DC

## 2018-05-14 NOTE — Progress Notes (Signed)
All of his medications are generic and are $10 for 90/days or $4 for 30 days supply.  There is not a cheaper alternative that I can prescribe for him.

## 2018-05-14 NOTE — Telephone Encounter (Signed)
After giving pt his lab results (normal) he mentioned he can't afford his medications.  I called the flow coordinator and let her know his situation.   She asked me to send the note over and she would let Luetta Nutting, DO know the situation.

## 2018-05-17 ENCOUNTER — Ambulatory Visit (INDEPENDENT_AMBULATORY_CARE_PROVIDER_SITE_OTHER): Payer: Medicare Other | Admitting: Family Medicine

## 2018-05-17 ENCOUNTER — Encounter: Payer: Self-pay | Admitting: Family Medicine

## 2018-05-17 ENCOUNTER — Telehealth: Payer: Self-pay | Admitting: Family Medicine

## 2018-05-17 ENCOUNTER — Ambulatory Visit (HOSPITAL_BASED_OUTPATIENT_CLINIC_OR_DEPARTMENT_OTHER)
Admission: RE | Admit: 2018-05-17 | Discharge: 2018-05-17 | Disposition: A | Discharge status: Home / Self Care | Payer: Medicare Other | Source: Ambulatory Visit | Attending: Family Medicine | Admitting: Family Medicine

## 2018-05-17 VITALS — BP 128/90 | HR 74 | Temp 97.9°F | Ht 68.0 in | Wt 192.0 lb

## 2018-05-17 DIAGNOSIS — I5023 Acute on chronic systolic (congestive) heart failure: Secondary | ICD-10-CM

## 2018-05-17 DIAGNOSIS — M79604 Pain in right leg: Secondary | ICD-10-CM

## 2018-05-17 DIAGNOSIS — M7731 Calcaneal spur, right foot: Secondary | ICD-10-CM | POA: Insufficient documentation

## 2018-05-17 DIAGNOSIS — R519 Headache, unspecified: Secondary | ICD-10-CM

## 2018-05-17 DIAGNOSIS — R51 Headache: Secondary | ICD-10-CM | POA: Diagnosis not present

## 2018-05-17 DIAGNOSIS — W19XXXA Unspecified fall, initial encounter: Secondary | ICD-10-CM | POA: Diagnosis not present

## 2018-05-17 DIAGNOSIS — I5021 Acute systolic (congestive) heart failure: Secondary | ICD-10-CM

## 2018-05-17 NOTE — Telephone Encounter (Signed)
Ct Head non contrast-results in epic-unremarkable. Phoned in by radiology.

## 2018-05-17 NOTE — Assessment & Plan Note (Signed)
-  No new weakness or deficits on exam today.  -Non-contrast CT scan ordered for ongoing headache, unsure if he had any head trauma from fall.  -HH PT for balance and gait training.

## 2018-05-17 NOTE — Patient Instructions (Addendum)
-  Increase your lasix to 80mg  every other day for the next 10 days. -I have ordered a home health nurse to come and help with medication management and to follow your weights. -You have an appt at [facility] at 1130 for CT scan and xrays

## 2018-05-17 NOTE — Progress Notes (Signed)
Terrence Garcia - 62 y.o. male MRN [id number]  Date of birth: [birth year]  Subjective Chief Complaint  Patient presents with  . Fall    HPI Beverley Thoma is a 62 y.o. male with history of Systolic CHF, polysubstance abuse and CVA here today with complain of fall.  He reports that fall occurred about 1 week ago.  He was walking in from feeding his dog and reports that his L leg "gave out".  He does have residual weakness in this leg from prior stroke and was not using his cane at the time.  He states that he rolled down a small hill a few feet.  He was able to get back up but once he reached the front of his house he tripped on the gravel and once again walking up the steps into his house.  He is unsure if he hit his head.  He denies syncope related to this.  He complains of ongoing headache since this incident as well as R leg and ankle pain.  He has pain along lateral R lower leg and lateral R ankle.  He is able to bear weight but it is painful.  Feels like leg is swollen.  He denies any dizziness or vision change related to headache at this time.   In regard to CHF, he did have some difficulty affording his meds but this was resolved with a change in pharmacy and he is now taking these.  His weight is up about 10lbs and and he does feel a little more short of breath with exertion.  He denies cough, pnd or orthopnea.  He reports he has abstained from smoking anything since discharge home.   ROS:  A comprehensive ROS was completed and negative except as noted per HPI.    Allergies  Allergen Reactions  . Lisinopril Swelling  . Iodinated Diagnostic Agents Nausea And Vomiting  . Penicillins Other (See Comments)    Has patient had a PCN reaction causing immediate rash, facial/tongue/throat swelling, SOB or lightheadedness with hypotension: no Has patient had a PCN reaction causing severe rash involving mucus membranes or skin necrosis: no Has patient had a PCN reaction that required  hospitalization: no Has patient had a PCN reaction occurring within the last 10 years: No If all of the above answers are "NO", then may proceed with Cephalosporin use. "pt states does nothing for him"    Past Medical History:  Diagnosis Date  . CHF (congestive heart failure) ([place])   . Chronic back pain   . Dyspnea   . Gastric ulcer   . Hypertension   . Prostate enlargement   . Stroke [facility])     Past Surgical History:  Procedure Laterality Date  . BACK SURGERY      Social History   Socioeconomic History  . Marital status: Married    Spouse name: Not on file  . Number of children: Not on file  . Years of education: Not on file  . Highest education level: Not on file  Occupational History  . Not on file  Social Needs  . Financial resource strain: Not on file  . Food insecurity:    Worry: Not on file    Inability: Not on file  . Transportation needs:    Medical: Not on file    Non-medical: Not on file  Tobacco Use  . Smoking status: Former Smoker    Last attempt to quit: 01/01/2018    Years since quitting: 0.3  . Smokeless tobacco:  Never Used  Substance and Sexual Activity  . Alcohol use: No  . Drug use: Yes    Types: Cocaine    Comment: states sober for 14 years and used crack two days ago   . Sexual activity: Not on file  Lifestyle  . Physical activity:    Days per week: Not on file    Minutes per session: Not on file  . Stress: Not on file  Relationships  . Social connections:    Talks on phone: Not on file    Gets together: Not on file    Attends religious service: Not on file    Active member of club or organization: Not on file    Attends meetings of clubs or organizations: Not on file    Relationship status: Not on file  Other Topics Concern  . Not on file  Social History Narrative  . Not on file    No family history on file.  Health Maintenance  Topic Date Due  . TETANUS/TDAP  02/16/1977  . COLONOSCOPY  02/17/2008  . INFLUENZA VACCINE   Completed  . Hepatitis C Screening  Completed  . HIV Screening  Completed    ----------------------------------------------------------------------------------------------------------------------------------------------------------------------------------------------------------------- Physical Exam BP 128/90   Pulse 74   Temp 97.9 F (36.6 C)   Ht 5\' 8"  (1.727 m)   Wt 192 lb (87.1 kg)   SpO2 95%   BMI 29.19 kg/m   Physical Exam  Constitutional: He is oriented to person, place, and time. He appears well-nourished. No distress.  HENT:  Head: Normocephalic and atraumatic.  Mouth/Throat: Oropharynx is clear and moist.  Eyes: Pupils are equal, round, and reactive to light. EOM are normal. No scleral icterus.  Neck: Normal range of motion. Neck supple. No thyromegaly present.  Cardiovascular: Normal rate, regular rhythm and normal heart sounds.  Pulmonary/Chest: Effort normal and breath sounds normal.  Musculoskeletal: He exhibits no edema.  TTP along lateral R ankle without swelling.  Mild swelling around fibular head on R with tenderness to palpation.   Lymphadenopathy:    He has no cervical adenopathy.  Neurological: He is alert and oriented to person, place, and time. No cranial nerve deficit. Coordination normal.  Walks with antalgic gait, uses cane.   Skin: Skin is warm and dry. No rash noted.  Psychiatric: He has a normal mood and affect. His behavior is normal.    ------------------------------------------------------------------------------------------------------------------------------------------------------------------------------------------------------------------- Assessment and Plan  Acute on chronic systolic CHF (congestive heart failure) (HCC) -Weight up 10lbs, some mild increased sob with exertion.   -Increase lasix to 80mg  every other day for the next 10 days -[facility] RN ordered to help with med management and daily weights.   Right leg pain -TTP and mild  swelling over fibular head, xrays ordered.   Fall -No new weakness or deficits on exam today.  -Non-contrast CT scan ordered for ongoing headache, unsure if he had any head trauma from fall.  -HH PT for balance and gait training.

## 2018-05-17 NOTE — Telephone Encounter (Signed)
FYI

## 2018-05-17 NOTE — Assessment & Plan Note (Signed)
-  Weight up 10lbs, some mild increased sob with exertion.   -Increase lasix to 80mg  every other day for the next 10 days -[facility] RN ordered to help with med management and daily weights.

## 2018-05-17 NOTE — Telephone Encounter (Signed)
PCP is aware of results and patient has been called. Nothing further needed

## 2018-05-17 NOTE — Assessment & Plan Note (Signed)
-  TTP and mild swelling over fibular head, xrays ordered.

## 2018-05-22 ENCOUNTER — Encounter: Payer: Self-pay | Admitting: Gastroenterology

## 2018-05-22 ENCOUNTER — Ambulatory Visit (INDEPENDENT_AMBULATORY_CARE_PROVIDER_SITE_OTHER): Payer: Medicare Other | Admitting: Gastroenterology

## 2018-05-22 VITALS — BP 124/62 | HR 42 | Ht 68.0 in | Wt 191.2 lb

## 2018-05-22 DIAGNOSIS — K219 Gastro-esophageal reflux disease without esophagitis: Secondary | ICD-10-CM

## 2018-05-22 DIAGNOSIS — D1803 Hemangioma of intra-abdominal structures: Secondary | ICD-10-CM | POA: Diagnosis not present

## 2018-05-22 DIAGNOSIS — R195 Other fecal abnormalities: Secondary | ICD-10-CM | POA: Diagnosis not present

## 2018-05-22 NOTE — Patient Instructions (Addendum)
If you are age 60 or older, your body mass index should be between 23-30. Your Body mass index is 29.08 kg/m. If this is out of the aforementioned range listed, please consider follow up with your Primary Care Provider.  If you are age 4 or younger, your body mass index should be between 19-25. Your Body mass index is 29.08 kg/m. If this is out of the aformentioned range listed, please consider follow up with your Primary Care Provider.  Fiber Chart  You should be consuming 25-30g of fiber per day and drinking 8 glasses of water to help your bowels move regularly.  In the chart below you can look up how much fiber you are getting in an average day.  If you are not getting enough fiber, you should add a fiber supplement to your diet.  Examples of this include Metamucil, FiberCon and Citrucel.  These can be purchased at your local grocery store or pharmacy.      http://reyes-guerrero.[email or web address]   Start supplemental fiber for goal of 1-2 formed, soft stools daily without straining to have a bowel movement. Recommend BeneFiber or Citrucel (or generic equivalent of these). Start with 1 scoop daily and titrate to desired effect.   It was a pleasure to see you today!  Terrence Garcia, D.O.

## 2018-05-22 NOTE — Progress Notes (Signed)
Chief Complaint: Change in stools   Referring Provider:     Luetta Nutting, DO    HPI:     Terrence Garcia is a 62 y.o. male with a history of CHF (EF 15% in May 2019; inpatient repeat was 20 to 25%), hypertension, chronic hypoxic respiratory failure on home oxygen 2 L/min, TIA/CVA, CKD 3, cocaine abuse/polysubstance abuse referred to the [facility] for evaluation of change in bowel habits.  He was recently admitted at the end of September (9/23-10/1) for CHF exacerbation.  Coreg was stopped and discharged with Lasix, hydralazine, isosorbide mononitrate, omeprazole, spironolactone, Flomax (none new).  Today, he states when he sits to urinate he frequently will have a BM or flatus. Stool is soft, formed. Sxs ongoing for approx 1 year not recently worsened. No hematochezia but with rare BRB on tissue paper following hard stools.  This has happened 1 or 2 times over the year. No dyschezia. Endorses lower abdominal pain which can resolve with BM, but not always. Had 1 episode of fecal seepage 6 months ago while at a party, but no recurrence. Does not sense urge to defecate with these episodes. Otherwise, has 1 formed stool daily which he senses the urge to defecate and has no trouble making it to the bathroom in time.  Then will have some form of BM with nearly each time he urinates. Trialed laxative months ago without change in sxs.  No diarrhea.  Increased urination 2/2 BPH and diurectics, so feels that he is constantly cleaning self.  Endorses separate MEG pain which occurs at random. Hx of PUD with GIB in 1980s per patient. No n/v.  No wt loss. Takes Prilosec qod which controls GERD sxs well. No dysphagia. Last EGD was in 1980's.   Last colonoscopy was in 2016 when in prison. No polyps.  Was not having LGI symptoms at that time.  Of note, was using a fiber supplement while in prison and shortly after presenting, without any lower GI symptoms.  No tobacco, EtOH or drug  use since recent hospital d/c earlier this month.   Repeat labs on 10/2 notable for normal CBC and BMP.  Liver enzymes were normal as an inpatient.  RUQ ultrasound in May 2019 notable for echogenic mass in the inferior right lobe of the liver measuring 3.4 x 2.5 x 3.5 cm (previously measuring up to 6.6 cm on CT in 2011) and another in the central liver measuring 1.5 x 1.3 x 1.9 cm.  Appearance likely hemangiomas with consideration for MRI liver (there was also a 4.7 x 4.8 cm left hepatic lobe lesion (hemangioma) noted on prior CT in 2011 that was not visualized on this study).  Otherwise normal gallbladder and no duct dilation.  Past Medical History:  Diagnosis Date  . CHF (congestive heart failure) ([place])   . Chronic back pain   . Dyspnea   . Gastric ulcer   . Hypertension   . Prostate enlargement   . Stroke [facility])      Past Surgical History:  Procedure Laterality Date  . BACK SURGERY     No family history on file. Social History   Tobacco Use  . Smoking status: Former Smoker    Last attempt to quit: 01/01/2018    Years since quitting: 0.3  . Smokeless tobacco: Never Used  Substance Use Topics  . Alcohol use: No  . Drug use: Yes    Types: Cocaine  Comment: states sober for 14 years and used crack two days ago    Current Outpatient Medications  Medication Sig Dispense Refill  . furosemide (LASIX) 40 MG tablet Take 1 tablet (40 mg total) by mouth daily. 90 tablet 1  . hydrALAZINE (APRESOLINE) 25 MG tablet Take 1 tablet (25 mg total) by mouth 3 (three) times daily. 90 tablet 1  . isosorbide mononitrate (IMDUR) 30 MG 24 hr tablet Take 1 tablet (30 mg total) by mouth daily. 90 tablet 1  . omeprazole (PRILOSEC) 40 MG capsule Take 1 capsule (40 mg total) by mouth 2 (two) times daily. 60 capsule 1  . spironolactone (ALDACTONE) 25 MG tablet Take 1 tablet (25 mg total) by mouth daily. 90 tablet 1  . tamsulosin (FLOMAX) 0.4 MG CAPS capsule Take 1 capsule (0.4 mg total) by mouth daily  after supper. 90 capsule 1   No current facility-administered medications for this visit.    Allergies  Allergen Reactions  . Lisinopril Swelling  . Iodinated Diagnostic Agents Nausea And Vomiting  . Penicillins Other (See Comments)    Has patient had a PCN reaction causing immediate rash, facial/tongue/throat swelling, SOB or lightheadedness with hypotension: no Has patient had a PCN reaction causing severe rash involving mucus membranes or skin necrosis: no Has patient had a PCN reaction that required hospitalization: no Has patient had a PCN reaction occurring within the last 10 years: No If all of the above answers are "NO", then may proceed with Cephalosporin use. "pt states does nothing for him"     Review of Systems: All systems reviewed and negative except where noted in HPI.     Physical Exam:    Wt Readings from Last 3 Encounters:  05/17/18 192 lb (87.1 kg)  05/09/18 182 lb (82.6 kg)  05/08/18 176 lb (79.8 kg)    There were no vitals taken for this visit. Constitutional:  Pleasant, in no acute distress.  Nasal cannula in place with home oxygen Psychiatric: Normal mood and affect. Behavior is normal. EENT: Pupils normal.  Conjunctivae are normal. No scleral icterus. Neck supple. No cervical LAD. Cardiovascular: Normal rate, regular rhythm. No edema Pulmonary/chest: Effort normal and breath sounds normal. No wheezing, rales or rhonchi. Abdominal: Soft, nondistended, nontender. Bowel sounds active throughout. There are no masses palpable. No hepatomegaly. Neurological: Alert and oriented to person place and time. Skin: Skin is warm and dry. No rashes noted.   ASSESSMENT AND PLAN;   Terrence Garcia is a 62 y.o. male presenting with:  1) change in bowel habits: Change in bowel habits over the last year with BM with nearly every time that he urinates.  Sensation otherwise seems intact as he has one normal formed stool daily but he clearly senses urge to defecate and  has no trouble making it to the bathroom in time (i.e. no fecal seepage or incontinence).  Discussed possible etiologies, to include medication effect, and will proceed as below: - Start fiber supplement -Increase dietary fiber - Discussed benefit of colonoscopy to evaluate for additional mucosal or luminal pathology.  Owing to his normal colonoscopy 3 years ago, he declined. - Discussed anal rectal manometry, and he decided to proceed with trial of fiber as above and if not beneficial will proceed with ARM to evaluate for anorectal pathology  2) heartburn: Reflux symptoms seem well controlled with Prilosec, which she takes every other day along with dietary modifications.  Briefly discussed upper endoscopy oh (has not had one since 1980s) to evaluate for erosive  esophagitis, Barrett's screening, as well as MEG pain as above, which she declined in favor of evaluation of primary issue at this time.  Can always revisit at follow-up appointment  3) hepatic hemangiomas: Reviewed recent imaging which seems consistent with at least 2 hepatic hemangiomas, which have decreased in size compared with a CT in 2011.  He was aware of these findings.  Discussed MRI liver for further characterization, which she declined at this time.  Can always review at follow-up appointment.  4) history of comorbidities: History of CHF with recent exacerbation and reduced EF along with home oxygen dependence.  If planning on any endoscopic evaluation, will need to be done at [facility].  Please see patient instructions for additional details regarding treatment plan and recommendations.   [place], DO, FACG  05/22/2018, 1:52 PM   Luetta Nutting, DO

## 2018-05-24 ENCOUNTER — Telehealth: Payer: Self-pay | Admitting: Family Medicine

## 2018-05-24 NOTE — Telephone Encounter (Signed)
Please advise 

## 2018-05-24 NOTE — Telephone Encounter (Signed)
Copied from [place] [id number]. Topic: General - Other >> May 24, 2018 12:06 PM Leward Quan A wrote: Reason for CRM: Terrence Garcia with [facility] called to say that patient was evaluated and is open. Will be seen 1 wk 1, 2 wk 1 and 1 wk 3. Also to inform that PT will call when evaluation is complete

## 2018-05-25 ENCOUNTER — Telehealth: Payer: Self-pay

## 2018-05-25 NOTE — Telephone Encounter (Signed)
Copied from [place] [id number]. Topic: General - Other >> May 25, 2018  2:51 PM Leward Quan A wrote: Reason for CRM: Roselie Awkward PT with [facility] called to say that patient was evaluated today and that he will be seeing him 2x's week for 4 weeks and 1x week for 2 weeks starting on 05/28/18. Per PT patient also weighed 188.8lbs on 05/24/18 before meals and 194.4lbs on 05/25/18 in the afternoon. Patient took 2 Lasix on 05/24/18 and 1 Lasix on 05/25/18 showed no signs of swelling anywhere. Please call Roselie Awkward with questions if any. Ph# [phone]

## 2018-05-25 NOTE — Telephone Encounter (Signed)
fyi

## 2018-06-04 ENCOUNTER — Telehealth: Payer: Self-pay | Admitting: Family Medicine

## 2018-06-04 NOTE — Telephone Encounter (Signed)
Copied from [place] #[id number]. Topic: Quick Communication - See Telephone Encounter >> Jun 04, 2018 10:18 AM Bea Graff, NT wrote: CRM for notification. See Telephone encounter for: 06/04/18. Pt states after he stopped the 80 mg lasixs his weight started going back up. He wants to know if he should start back up the 80mg  lasixs. Please advise.

## 2018-06-04 NOTE — Telephone Encounter (Signed)
Have patient come in to see Dr. Zigmund Daniel on Thursday.

## 2018-06-04 NOTE — Telephone Encounter (Signed)
Please advise 

## 2018-06-04 NOTE — Telephone Encounter (Unsigned)
Copied from [place] [id number]. Topic: General - Other >> Jun 04, 2018  9:26 AM Parke Poisson wrote: Reason for CRM: Roselie Awkward from [facility] called to advise that pt's weight is 196 today.On 06/01/18 he weighed 192.5.Pt states his abdomen feels bloated.He is on lasix taking 1 time a day since last Tuesday

## 2018-06-06 NOTE — Telephone Encounter (Signed)
Patient has been scheduled for an appointment. The soonest the patient could come in for an appointment is 06/12/2018.

## 2018-06-08 NOTE — Telephone Encounter (Signed)
Increase lasix to 80mg  daily, rather than alternating.  Keep appt with me for 11/5.  Continue daily weights.

## 2018-06-08 NOTE — Telephone Encounter (Signed)
Spoke with Roselie Awkward and gave PCP instructions on increasing lasix to 80 mg daily rather than alternating. Also patient needs to keep upcoming appointment and continue daily weights.  Roselie Awkward will call and inform the patient nothing further needed.

## 2018-06-08 NOTE — Telephone Encounter (Signed)
Terrence Garcia (Physical Therapist [facility]) states that Pt weighed 200 lbs today. That is a 4-5 lb difference in 2 days. He had already eaten before he way weighed. Also states that pt is taking Lasix on an alternating schedule (2 pills on 1 day and 1 pill the next). Please advise.  CB#[phone]

## 2018-06-12 ENCOUNTER — Ambulatory Visit: Payer: Medicare Other | Admitting: Gastroenterology

## 2018-06-12 ENCOUNTER — Ambulatory Visit (INDEPENDENT_AMBULATORY_CARE_PROVIDER_SITE_OTHER): Payer: Medicare Other | Admitting: Family Medicine

## 2018-06-12 ENCOUNTER — Encounter: Payer: Self-pay | Admitting: Family Medicine

## 2018-06-12 VITALS — BP 140/90 | HR 47 | Temp 97.8°F | Ht 68.0 in | Wt 201.6 lb

## 2018-06-12 DIAGNOSIS — I5022 Chronic systolic (congestive) heart failure: Secondary | ICD-10-CM | POA: Diagnosis not present

## 2018-06-12 DIAGNOSIS — I5023 Acute on chronic systolic (congestive) heart failure: Secondary | ICD-10-CM

## 2018-06-12 DIAGNOSIS — R001 Bradycardia, unspecified: Secondary | ICD-10-CM

## 2018-06-12 LAB — BASIC METABOLIC PANEL WITH GFR
BUN: 22 mg/dL (ref 6–23)
CO2: 27 meq/L (ref 19–32)
Calcium: 9.3 mg/dL (ref 8.4–10.5)
Chloride: 104 meq/L (ref 96–112)
Creatinine, Ser: 1.53 mg/dL — ABNORMAL HIGH (ref 0.40–1.50)
GFR: 59.94 mL/min — ABNORMAL LOW
Glucose, Bld: 93 mg/dL (ref 70–99)
Potassium: 3.8 meq/L (ref 3.5–5.1)
Sodium: 139 meq/L (ref 135–145)

## 2018-06-12 NOTE — Assessment & Plan Note (Signed)
-  He will increase lasix to 60mg  BID (120mg  daily) -Follow low salt diet. -Continue to monitor daily weights, if weight continues to increase we discussed adding metolazone for a few days.   -labs ordered today -Cardiology referral placed -I will see him back in 1 week.

## 2018-06-12 NOTE — Progress Notes (Signed)
Terrence Garcia - 62 y.o. male MRN [id number]  Date of birth: 1956-05-22  Subjective Chief Complaint  Patient presents with  . Weight Gain    HPI Terrence Garcia is a 62 y.o. male with history of systolic chf and htn.  Also with history of crack cocaine and marijuana dependence but reports quitting cold [place] since last hospitalization.  He has had home health RN and PT since last visit with me who noticed that weight was up about 10lbs from baseline.  He is here today for follow up of weight gain.  He reports that he has felt a little more short of breath with difficulty laying flat.  He also thinks that his increased appetite has contributed to his weight gain.  He is currently on 80mg  of lasix/day as well as aldactone.  He uses O2 in the evenings or with ambulation.  He denies anginal symptoms, dizziness, wheezing or increased edema.  He has not seen cardiology since d/c from  [place]:  A comprehensive ROS was completed and negative except as noted per HPI.  Allergies  Allergen Reactions  . Lisinopril Swelling  . Iodinated Diagnostic Agents Nausea And Vomiting  . Penicillins Other (See Comments)    Has patient had a PCN reaction causing immediate rash, facial/tongue/throat swelling, SOB or lightheadedness with hypotension: no Has patient had a PCN reaction causing severe rash involving mucus membranes or skin necrosis: no Has patient had a PCN reaction that required hospitalization: no Has patient had a PCN reaction occurring within the last 10 years: No If all of the above answers are "NO", then may proceed with Cephalosporin use. "pt states does nothing for him"    Past Medical History:  Diagnosis Date  . CHF (congestive heart failure) ([place])   . Chronic back pain   . Chronic headaches   . Dyspnea   . Gastric ulcer   . Hypertension   . Prostate enlargement   . Seizures ([place])   . Stroke [facility])     Past Surgical History:  Procedure Laterality Date  . BACK SURGERY     Removal of cyst in 2016 or 2017  . COLONOSCOPY  2015   while in prison    Social History   Socioeconomic History  . Marital status: Married    Spouse name: Not on file  . Number of children: 3  . Years of education: Not on file  . Highest education level: Not on file  Occupational History  . Occupation: Disabled  Social Needs  . Financial resource strain: Not on file  . Food insecurity:    Worry: Not on file    Inability: Not on file  . Transportation needs:    Medical: Not on file    Non-medical: Not on file  Tobacco Use  . Smoking status: Former Smoker    Last attempt to quit: 01/01/2018    Years since quitting: 0.4  . Smokeless tobacco: Never Used  Substance and Sexual Activity  . Alcohol use: No  . Drug use: Yes    Types: Cocaine    Comment: states sober for 14 years and used crack two days ago   . Sexual activity: Not on file  Lifestyle  . Physical activity:    Days per week: Not on file    Minutes per session: Not on file  . Stress: Not on file  Relationships  . Social connections:    Talks on phone: Not on file    Gets together:  Not on file    Attends religious service: Not on file    Active member of club or organization: Not on file    Attends meetings of clubs or organizations: Not on file    Relationship status: Not on file  Other Topics Concern  . Not on file  Social History Narrative  . Not on file    Family History  Problem Relation Age of Onset  . Prostate cancer Brother   . Prostate cancer Maternal Uncle   . Prostate cancer Brother   . Colon cancer Neg Hx   . Esophageal cancer Neg Hx     Health Maintenance  Topic Date Due  . TETANUS/TDAP  02/16/1977  . COLONOSCOPY  02/17/2008  . INFLUENZA VACCINE  Completed  . Hepatitis C Screening  Completed  . HIV Screening  Completed     ----------------------------------------------------------------------------------------------------------------------------------------------------------------------------------------------------------------- Physical Exam BP 140/90   Pulse (!) 47   Temp 97.8 F (36.6 C) (Oral)   Ht 5\' 8"  (1.727 m)   Wt 201 lb 9.6 oz (91.4 kg)   SpO2 97%   BMI 30.65 kg/m   Physical Exam  Constitutional: He is oriented to person, place, and time. He appears well-nourished. No distress.  HENT:  Head: Normocephalic and atraumatic.  Mouth/Throat: Oropharynx is clear and moist.  Eyes: No scleral icterus.  Neck: Neck supple.  Cardiovascular: Normal rate, regular rhythm and normal heart sounds.  Pulmonary/Chest: Effort normal.  Mild bi-basilar crackles.   Abdominal: He exhibits no distension.  Musculoskeletal: He exhibits no edema.  Neurological: He is alert and oriented to person, place, and time.  Skin: Skin is warm and dry.  Psychiatric: He has a normal mood and affect. His behavior is normal.    ------------------------------------------------------------------------------------------------------------------------------------------------------------------------------------------------------------------- Assessment and Plan  Acute on chronic systolic CHF (congestive heart failure) ([place]) -He will increase lasix to 60mg  BID (120mg  daily) -Follow low salt diet. -Continue to monitor daily weights, if weight continues to increase we discussed adding metolazone for a few days.   -labs ordered today -Cardiology referral placed -I will see him back in 1 week.

## 2018-06-12 NOTE — Patient Instructions (Signed)
Increase lasix to 60mg  twice per day (1.5 tabs twice per day) Let me know if your weight continues to increase or you have increased symptoms.  See me again in 1 week.  You will be contacted from cardiology as well.

## 2018-06-13 ENCOUNTER — Ambulatory Visit: Payer: Medicare Other | Admitting: Internal Medicine

## 2018-06-14 NOTE — Progress Notes (Signed)
Renal function stable How are his weights at home? Continue lasix as directed at last appt.  Keep upcoming f/u on 11/12

## 2018-06-19 ENCOUNTER — Encounter: Payer: Self-pay | Admitting: Family Medicine

## 2018-06-19 ENCOUNTER — Ambulatory Visit (INDEPENDENT_AMBULATORY_CARE_PROVIDER_SITE_OTHER): Payer: Medicare Other | Admitting: Family Medicine

## 2018-06-19 VITALS — BP 134/88 | HR 75 | Temp 98.2°F | Ht 68.0 in | Wt 203.4 lb

## 2018-06-19 DIAGNOSIS — I10 Essential (primary) hypertension: Secondary | ICD-10-CM

## 2018-06-19 DIAGNOSIS — I5023 Acute on chronic systolic (congestive) heart failure: Secondary | ICD-10-CM | POA: Diagnosis not present

## 2018-06-19 LAB — BASIC METABOLIC PANEL
BUN: 20 mg/dL (ref 6–23)
CALCIUM: 9.2 mg/dL (ref 8.4–10.5)
CO2: 27 meq/L (ref 19–32)
CREATININE: 1.39 mg/dL (ref 0.40–1.50)
Chloride: 102 mEq/L (ref 96–112)
GFR: 66.96 mL/min (ref >60.00)
GLUCOSE: 76 mg/dL (ref 70–99)
Potassium: 3.4 mEq/L — ABNORMAL LOW (ref 3.5–5.1)
Sodium: 138 mEq/L (ref 135–145)

## 2018-06-19 MED ORDER — METOLAZONE 10 MG PO TABS: 10.0000 mg | ORAL_TABLET | Freq: Every day | ORAL | 0 refills | Status: DC

## 2018-06-19 NOTE — Progress Notes (Signed)
Terrence Garcia - 62 y.o. male MRN [id number]  Date of birth: Jun 14, 1956  Subjective Chief Complaint  Patient presents with  . Follow-up    pt f/u from 1 week. pt still feeling SOB and tired , pt been taking his oxygen     HPI Terrence Garcia is a 62 y.o. male with history of CHF, HTN, and CVA here today for follow up of CHF.  Seen last week and weight increased, lasix increased to 60mg  BID.  He reports good urine output but continues to feel shortness of breath and fatigue.  Weight is up an additional 2 lbs today.  He is using O2 at night and with exertional activities.  He denies high salt intake.  He denies increased LE edema.  He has appt with cardiology on 11/14 in [facility].  He denies chest pain, dizziness, orthopnea.  He reports that she continues to abstain from all substance use.   ROS:  A comprehensive ROS was completed and negative except as noted per HPI  Allergies  Allergen Reactions  . Lisinopril Swelling  . Iodinated Diagnostic Agents Nausea And Vomiting  . Penicillins Other (See Comments)    Has patient had a PCN reaction causing immediate rash, facial/tongue/throat swelling, SOB or lightheadedness with hypotension: no Has patient had a PCN reaction causing severe rash involving mucus membranes or skin necrosis: no Has patient had a PCN reaction that required hospitalization: no Has patient had a PCN reaction occurring within the last 10 years: No If all of the above answers are "NO", then may proceed with Cephalosporin use. "pt states does nothing for him"    Past Medical History:  Diagnosis Date  . CHF (congestive heart failure) ([place])   . Chronic back pain   . Chronic headaches   . Dyspnea   . Gastric ulcer   . Hypertension   . Prostate enlargement   . Seizures ([place])   . Stroke [facility])     Past Surgical History:  Procedure Laterality Date  . BACK SURGERY     Removal of cyst in 2016 or 2017  . COLONOSCOPY  2015   while in prison    Social History    Socioeconomic History  . Marital status: Married    Spouse name: Not on file  . Number of children: 3  . Years of education: Not on file  . Highest education level: Not on file  Occupational History  . Occupation: Disabled  Social Needs  . Financial resource strain: Not on file  . Food insecurity:    Worry: Not on file    Inability: Not on file  . Transportation needs:    Medical: Not on file    Non-medical: Not on file  Tobacco Use  . Smoking status: Former Smoker    Last attempt to quit: 01/01/2018    Years since quitting: 0.4  . Smokeless tobacco: Never Used  Substance and Sexual Activity  . Alcohol use: No  . Drug use: Yes    Types: Cocaine    Comment: states sober for 14 years and used crack two days ago   . Sexual activity: Not on file  Lifestyle  . Physical activity:    Days per week: Not on file    Minutes per session: Not on file  . Stress: Not on file  Relationships  . Social connections:    Talks on phone: Not on file    Gets together: Not on file    Attends religious service: Not  on file    Active member of club or organization: Not on file    Attends meetings of clubs or organizations: Not on file    Relationship status: Not on file  Other Topics Concern  . Not on file  Social History Narrative  . Not on file    Family History  Problem Relation Age of Onset  . Prostate cancer Brother   . Prostate cancer Maternal Uncle   . Prostate cancer Brother   . Colon cancer Neg Hx   . Esophageal cancer Neg Hx     Health Maintenance  Topic Date Due  . TETANUS/TDAP  02/16/1977  . COLONOSCOPY  02/17/2008  . INFLUENZA VACCINE  Completed  . Hepatitis C Screening  Completed  . HIV Screening  Completed    ----------------------------------------------------------------------------------------------------------------------------------------------------------------------------------------------------------------- Physical Exam There were no vitals taken  for this visit.  Physical Exam  Constitutional: He is oriented to person, place, and time. He appears well-nourished. No distress.  HENT:  Head: Normocephalic and atraumatic.  Mouth/Throat: Oropharynx is clear and moist.  Eyes: No scleral icterus.  Neck: Neck supple. No thyromegaly present.  Cardiovascular: Normal rate, regular rhythm and normal heart sounds.  Pulmonary/Chest: Effort normal.  Bibasilar crackles.   Musculoskeletal: He exhibits no edema.  Neurological: He is alert and oriented to person, place, and time.  Skin: Skin is warm and dry.  Psychiatric: He has a normal mood and affect. His behavior is normal.    ------------------------------------------------------------------------------------------------------------------------------------------------------------------------------------------------------------------- Assessment and Plan  Benign essential HTN -Weight up additional 2 lbs with continues fatigue and shortness of breath.  Mild bibasilar crackles on exam.  -Will have him return to 80mg  of lasix but add on metolazone 10mg  until seen by cardiology on 11/14.  -Follow low salt diet.  -He is aware to go to hospital for significantly worsening symptoms.

## 2018-06-19 NOTE — Assessment & Plan Note (Signed)
-  Weight up additional 2 lbs with continues fatigue and shortness of breath.  Mild bibasilar crackles on exam.  -Will have him return to 80mg  of lasix but add on metolazone 10mg  until seen by cardiology on 11/14.  -Follow low salt diet.  -He is aware to go to hospital for significantly worsening symptoms.

## 2018-06-19 NOTE — Patient Instructions (Signed)
-  Decrease furosemide from 120mg  to 80mg  daily  -Start metolazone 10mg  daily until you see cardiology on Thursday.  -Continue checking weights at home.

## 2018-06-20 ENCOUNTER — Other Ambulatory Visit: Payer: Self-pay | Admitting: Family Medicine

## 2018-06-20 MED ORDER — POTASSIUM CHLORIDE CRYS ER 20 MEQ PO TBCR: 20.0000 meq | EXTENDED_RELEASE_TABLET | Freq: Every day | ORAL | 0 refills | Status: DC

## 2018-06-20 NOTE — Progress Notes (Signed)
Potassium is a little low.  I have sent in a potassium supplement that I want him to start taking with his furosemide.

## 2018-06-21 ENCOUNTER — Encounter: Payer: Self-pay | Admitting: Cardiology

## 2018-06-21 ENCOUNTER — Ambulatory Visit (INDEPENDENT_AMBULATORY_CARE_PROVIDER_SITE_OTHER): Payer: Medicare Other | Admitting: Cardiology

## 2018-06-21 VITALS — BP 140/82 | HR 79 | Ht 68.5 in | Wt 204.1 lb

## 2018-06-21 DIAGNOSIS — Z7189 Other specified counseling: Secondary | ICD-10-CM | POA: Diagnosis not present

## 2018-06-21 DIAGNOSIS — I5042 Chronic combined systolic (congestive) and diastolic (congestive) heart failure: Secondary | ICD-10-CM | POA: Insufficient documentation

## 2018-06-21 DIAGNOSIS — Z79899 Other long term (current) drug therapy: Secondary | ICD-10-CM | POA: Diagnosis not present

## 2018-06-21 DIAGNOSIS — I427 Cardiomyopathy due to drug and external agent: Secondary | ICD-10-CM

## 2018-06-21 MED ORDER — CARVEDILOL 3.125 MG PO TABS: 3.1250 mg | ORAL_TABLET | Freq: Two times a day (BID) | ORAL | 3 refills | Status: DC

## 2018-06-21 NOTE — Progress Notes (Signed)
Cardiology Office Note:    Date:  06/21/2018   ID:  Terrence Garcia, DOB [birth year], MRN [id number]  PCP:  Terrence Nutting, DO  Cardiologist:  Dr. Meda Garcia in the hospital, prefers [place] locati  Referring MD: Terrence Nutting, DO   CC: post hospital follow up  History of Present Illness:    Terrence Garcia is a 62 y.o. male with a hx of chronic systolic heart failure, ploysubstance abuse, hypertension, hyperlipidemia, TIA/CVA, intracranial hypertension, chronic kidney disease who is seen as a new patient to me/previously seen in consult in the hospital at the request of Terrence Nutting, DO for the evaluation and management of chronic systolic heart failure.  Most recent echo 05/01/18 noted severe LV dilation, mild cLVH, and EF 20-25%. Initial echo I have was in 12/2017 noting EF of 15%. Stress test 04/2017 (at the time of TIA) noted EF 32%, but echo showed EF 55-60%. Per review in the hospital, there have been issues with adherence. Decision at that time was not to pursue cath for ischemic workup, given prior nonischemic stress test and likelihood of cocaine induced cardiomyopathy. He was not treated with beta blocker due to active cocaine use, and his CKD and hypotension limited ACEI/ARB.  Patient concerns today: here with his wife. His main concern is headaches/vascular tinnitus that is similar to prior when he had a spinal tap at [place] by tylenol+sinus medication  -has worked hard to avoid avoid all tobacco and cocaine, including second hand exposure. Congratulated him on this. -weighing himself daily. Weight 200 lb yesterday, 202 today. Appetite has increased, he and his wife think he is eating more since quitting smoking. Highest weight has been 208 lbs.  -very thirsty, does drink a lot of fluid.  -avoiding salt. Doesn't add to food, trying to watch labels. Cooks most of his own food. -trying to stay active around the house. Gets very short of breath with minimal exertion. Does  physical therapy, but afterwards he is worn out and goes back to bed. Has had a lot of falls from leg giving out/feeling dizzy, but not syncope.  -checks blood pressure at home, runs around 110s/70s before he takes his medications in the morning. Has noted that it is 100s/60s later in the day, then goes back up at night. -wearing O2 at night. -not yet filled the metolazone or the potassium -very good about TID hydralazine, only misses ~1 dose/week  Past Medical History:  Diagnosis Date  . CHF (congestive heart failure) ([place])   . Chronic back pain   . Chronic headaches   . Dyspnea   . Gastric ulcer   . Hypertension   . Prostate enlargement   . Seizures ([place])   . Stroke [facility])     Past Surgical History:  Procedure Laterality Date  . BACK SURGERY     Removal of cyst in 2016 or 2017  . COLONOSCOPY  2015   [facility] in [facility]    Current Medications: Current Outpatient Medications on File Prior to Visit  Medication Sig  . furosemide (LASIX) 40 MG tablet Take 1 tablet (40 mg total) by mouth daily.  . hydrALAZINE (APRESOLINE) 25 MG tablet Take 1 tablet (25 mg total) by mouth 3 (three) times daily.  . isosorbide mononitrate (IMDUR) 30 MG 24 hr tablet Take 1 tablet (30 mg total) by mouth daily.  Marland Kitchen omeprazole (PRILOSEC) 40 MG capsule Take 1 capsule (40 mg total) by mouth 2 (two) times daily.  . OXYGEN Inhale into the lungs continuous.  Marland Kitchen  spironolactone (ALDACTONE) 25 MG tablet Take 1 tablet (25 mg total) by mouth daily.  . tamsulosin (FLOMAX) 0.4 MG CAPS capsule Take 1 capsule (0.4 mg total) by mouth daily after supper.  . metolazone (ZAROXOLYN) 10 MG tablet Take 1 tablet (10 mg total) by mouth daily. (Patient not taking: Reported on 06/21/2018)  . potassium chloride SA (K-DUR,KLOR-CON) 20 MEQ tablet Take 1 tablet (20 mEq total) by mouth daily. (Patient not taking: Reported on 06/21/2018)   No current facility-administered medications on file prior to visit.      Allergies:   Lisinopril;  Iodinated diagnostic agents; and Penicillins   Social History   Socioeconomic History  . Marital status: Married    Spouse name: Not on file  . Number of children: 3  . Years of education: Not on file  . Highest education level: Not on file  Occupational History  . Occupation: Disabled  Social Needs  . Financial resource strain: Not on file  . Food insecurity:    Worry: Not on file    Inability: Not on file  . Transportation needs:    Medical: Not on file    Non-medical: Not on file  Tobacco Use  . Smoking status: Former Smoker    Last attempt to quit: 01/01/2018    Years since quitting: 0.4  . Smokeless tobacco: Never Used  Substance and Sexual Activity  . Alcohol use: No  . Drug use: Yes    Types: Cocaine    Comment: states sober for 14 years and used crack two days ago   . Sexual activity: Not on file  Lifestyle  . Physical activity:    Days per week: Not on file    Minutes per session: Not on file  . Stress: Not on file  Relationships  . Social connections:    Talks on phone: Not on file    Gets together: Not on file    Attends religious service: Not on file    Active member of club or organization: Not on file    Attends meetings of clubs or organizations: Not on file    Relationship status: Not on file  Other Topics Concern  . Not on file  Social History Narrative  . Not on file     Family History: The patient's family history includes Prostate cancer in his brother, brother, and maternal uncle. There is no history of Colon cancer or Esophageal cancer.  ROS:   Please see the history of present illness.  Additional pertinent ROS:  Constitutional: Negative for chills, fever, night sweats, unintentional weight loss. Positive for increased appetite HENT: Negative for ear pain and hearing loss.  Positive for vascular tinnitus Eyes: Negative for loss of vision and eye pain.  Respiratory: Positive for dyspnea on exertion. Negative for cough, sputum, shortness  of breath at rest, wheezing.   Cardiovascular: Negative for palpitations and claudication. Has occasional PND, orthopnea, lower extremity edema and chest heaviness (with breathing).  Gastrointestinal: Negative for abdominal pain, melena, and hematochezia.  Genitourinary: Negative for dysuria. Has had hematuria in the past, not recently. Has frequency on lasix.  Musculoskeletal: Negative for myalgias. Positive for falls Skin: Negative for itching and rash.  Neurological: Negative for focal weakness, focal sensory changes and loss of consciousness. Positive for headaches and occasional lightheadedness Endo/Heme/Allergies: Does not bruise/bleed easily.    EKGs/Labs/Other Studies Reviewed:    The following studies were reviewed today:  Echo 05/01/18 Study Conclusions  - Left ventricle: The cavity size was severely  dilated. There was   mild concentric hypertrophy. Systolic function was severely   reduced. The estimated ejection fraction was in the range of 20%   to 25%. Severe diffuse hypokinesis. There was an increased   relative contribution of atrial contraction to ventricular   filling. Doppler parameters are consistent with abnormal left   ventricular relaxation (grade 1 diastolic dysfunction). - Aortic valve: Trileaflet; mildly thickened, mildly calcified   leaflets. There was trivial regurgitation. - Mitral valve: There was mild regurgitation. - Left atrium: The atrium was moderately dilated. - Pulmonary arteries: PA peak pressure: 37 mm Hg (S).  Impressions:  - The right ventricular systolic pressure was increased consistent   with mild pulmonary hypertension.  Echo 01/01/18 Study Conclusions  - Left ventricle: The cavity size was mildly dilated. Wall   thickness was increased in a pattern of mild LVH. Systolic   function was severely reduced. The estimated ejection fraction   was 15%. Diffuse hypokinesis. Doppler parameters are consistent   with high ventricular  filling pressure. - Aortic valve: There was trivial regurgitation. - Aortic root: The aortic root was mildly dilated. - Mitral valve: There was mild regurgitation. - Left atrium: The atrium was moderately dilated. - Pericardium, extracardiac: A small pericardial effusion was   identified.  Impressions:  - Severe global reduction in LV systolic function; elevated LV   filling pressure; mild LVH and LVE; trace AI; mildly dilated   aortic root; mild MR; moderate LAE; small pericardial effusion   with mild RA collapse.  Transthoracic Echocardiography Report (TTE)  04/19/2017  Summary Ejection fraction is visually estimated at 55-60% Normal size left atrium. No significant Doppler findings. No significant valvular abnormalities. negative bubble study for cardiac shunting Signature ------------------------------------------------------------------------------ Electronically signed by Alinda Money, MD, FACC(Interpreting physician) on 04/19/2017 05:35 PM  MYOCARDIAL IMAGING WITH SPECT (REST AND PHARMACOLOGIC-STRESS) 04/21/2017 FINDINGS: Raw images: Mild patient motion artifact is seen on both provided rest and stress images. No significant chest wall or diaphragmatic attenuation is seen on either the rest or stress images.  Perfusion: No decreased activity in the left ventricle on stress imaging to suggest reversible ischemia or infarction.  Wall Motion: The left ventricle is dilated. There is global hypokinesia with relative akinesia involving the septum.  Left Ventricular Ejection Fraction: 32 %  End diastolic volume 884 ml End systolic volume 166 ml  IMPRESSION: 1. No scintigraphic evidence of prior infarction or pharmacologically induced ischemia. 2. The left ventricle is dilated with global hypokinesia and relative akinesia involving the septum. 3. Ejection fraction - 32%.   EKG:  EKG is personally reviewed.  The ekg ordered today demonstrates normal sinus rhythm,  LVH with repol changes  Recent Labs: 12/31/2017: TSH 1.178 01/03/2018: Magnesium 2.2 04/30/2018: ALT 17; B Natriuretic Peptide 448.5 05/09/2018: Hemoglobin 13.2; Platelets 232.0 06/19/2018: BUN 20; Creatinine, Ser 1.39; Potassium 3.4; Sodium 138  Recent Lipid Panel    Component Value Date/Time   CHOL 180 01/02/2018 0452   TRIG 48 01/02/2018 0452   HDL 57 01/02/2018 0452   CHOLHDL 3.2 01/02/2018 0452   VLDL 10 01/02/2018 0452   LDLCALC 113 (H) 01/02/2018 0452    Physical Exam:    VS:  BP 140/82 (BP Location: Left Arm, Patient Position: Sitting, Cuff Size: Large)   Pulse 79   Ht 5' 8.5" (1.74 m)   Wt 204 lb 1.9 oz (92.6 kg)   BMI 30.58 kg/m     Wt Readings from Last 3 Encounters:  06/21/18 204 lb 1.9  oz (92.6 kg)  06/19/18 203 lb 6.4 oz (92.3 kg)  06/12/18 201 lb 9.6 oz (91.4 kg)     GEN: Well nourished, well developed in no acute distress HEENT: Normal NECK:  JVD 8 cm without HJR; No carotid bruits LYMPHATICS: No lymphadenopathy CARDIAC: regular rhythm, normal S1 and S2, no murmurs, rubs, gallops. Radial and DP pulses 2+ bilaterally. RESPIRATORY:  Clear to auscultation without rales, wheezing or rhonchi  ABDOMEN: Soft, non-tender, non-distended MUSCULOSKELETAL:  No edema at all; No deformity  SKIN: Warm and dry NEUROLOGIC:  Alert and oriented x 3 PSYCHIATRIC:  Normal affect   ASSESSMENT:    1. Chronic combined systolic and diastolic heart failure ([place])   2. Encounter for education about heart failure   3. Counseling on health promotion and disease prevention   4. Medication management   5. 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 On imdur/hydralazine, spironolactone (possobly--he is unsure), lasix. Not yet picked up metolazone -discussed at length today. He states that he is  completely abstinent from all substances. Discussed the theoretical risk of beta blockers and cocaine. He is amenable to starting beta blocker today. Will start with low dose carvedilol 3.125 mg BID -recheck echo in 3 mos after being on beta blocker and spironolactone -recommend filling potassium supplement given low K -he appears euvolemic to me, and his weight is stable. Would not start metolazone (discontinued today). Good urine output on lasix--used the restroom twice during his visit. -weight range: has been within a 5lb range.  -etiology: presumed 2/2 cocaine use. Prior stress test normal.   Heart failure education: Do the following things EVERY DAY:  1) Weigh yourself EVERY morning after you go to the bathroom but before you eat or drink anything. Write this number down in a weight log/diary. If you gain 3 pounds overnight or 5 pounds in a week, call the office.  2) Take your medicines as prescribed. If you have concerns about your medications, please call us before you stop taking them.   3) Eat low salt foods-Limit salt (sodium) to 2000 mg per day. This will help prevent your body from holding onto fluid. Read food labels as many processed foods have a lot of sodium, especially canned goods and prepackaged meats. If you would like some assistance choosing low sodium foods, we would be happy to set you up with a nutritionist.  4) Stay as active as you can everyday. Staying active will give you more energy and make your muscles stronger. Start with 5 minutes at a time and work your way up to 30 minutes a day. Break up your activities--do some in the morning and some in the afternoon. Start with 3 days per week and work your way up to 5 days as you can.  If you have chest pain, feel short of breath, dizzy, or lightheaded, STOP. If you don't feel better after a short rest, call 911. If you do feel better, call the office to let us know you have symptoms with exercise.  5) Limit all fluids for  the day to less than 2 liters. Fluid includes all drinks, Garcia, juice, ice chips, soup, jello, and all other liquids.  Prevention and health counseling: secondary prevention given stroke/TIA history -recommend heart healthy/Mediterranean diet, with whole grains, fruits, vegetable, fish, lean meats, nuts, and olive oil. Limit salt. -  recommend moderate walking, 3-5 times/week for 30-50 minutes each session. Aim for at least 150 minutes.week. Goal should be pace of 3 miles/hours, or walking 1.5 miles in 30 minutes -recommend avoidance of tobacco products. Avoid excess alcohol. -Additional risk factor control:  -Diabetes: A1c is not available, but blood sugars in the hospital were normal  -Lipids: LDL 113, HDL 57. Goal LDL <70. Not on statin, discuss in the future. Will focus on HF meds to start  -Blood pressure control: as above, tolerating meds and starting carvedilol  -Weight: BMI 30.6. Counseled on lifestyle  Plan for follow up: echo in 3 mos with visit shortly after.  Medication Adjustments/Labs and Tests Ordered: Current medicines are reviewed at length with the patient today.  Concerns regarding medicines are outlined above.  No orders of the defined types were placed in this encounter.  No orders of the defined types were placed in this encounter.   Patient Instructions  Do the following things EVERY DAY:  6) Weigh yourself EVERY morning after you go to the bathroom but before you eat or drink anything. Write this number down in a weight log/diary. If you gain 3 pounds overnight or 5 pounds in a week, call the office.  7) Take your medicines as prescribed. If you have concerns about your medications, please call us before you stop taking them.   8) Eat low salt foods-Limit salt (sodium) to 2000 mg per day. This will help prevent your body from holding onto fluid. Read food labels as many processed foods have a lot of sodium, especially canned goods and prepackaged meats. If you  would like some assistance choosing low sodium foods, we would be happy to set you up with a nutritionist.  9) Stay as active as you can everyday. Staying active will give you more energy and make your muscles stronger. Start with 5 minutes at a time and work your way up to 30 minutes a day. Break up your activities--do some in the morning and some in the afternoon. Start with 3 days per week and work your way up to 5 days as you can.  If you have chest pain, feel short of breath, dizzy, or lightheaded, STOP. If you don't feel better after a short rest, call 911. If you do feel better, call the office to let us know you have symptoms with exercise.  10) Limit all fluids for the day to less than 2 liters. Fluid includes all drinks, Garcia, juice, ice chips, soup, jello, and all other liquids.    Signed, Buford Dresser, MD PhD 06/21/2018 12:10 PM    [place]

## 2018-06-21 NOTE — Patient Instructions (Addendum)
Medication Instructions:  STOP METOLAZONE START COREG 3.125MG  TWO TIMES A DAY  If you need a refill on your cardiac medications before your next appointment, please call your pharmacy.   Lab work:NONE  If you have labs (blood work) drawn today and your tests are completely normal, you will receive your results only by: Marland Kitchen MyChart Message (if you have MyChart) OR . A paper copy in the mail If you have any lab test that is abnormal or we need to change your treatment, we will call you to review the results.  Testing/Procedures: Your physician has requested that you have an echocardiogram IN 3 MONTHS. Echocardiography is a painless test that uses sound waves to create images of your heart. It provides your doctor with information about the size and shape of your heart and how well your heart's chambers and valves are working. This procedure takes approximately one hour. There are no restrictions for this procedure.  [facility]-1ST FLOOR   Follow-Up: . Your physician recommends that you schedule a follow-up appointment in: 3 MONTHS after ECHO .    Any Other Special Instructions Will Be Listed Below (If Applicable).   Do the following things EVERY DAY:  1) Weigh yourself EVERY morning after you go to the bathroom but before you eat or drink anything. Write this number down in a weight log/diary. If you gain 3 pounds overnight or 5 pounds in a week, call the office.  2) Take your medicines as prescribed. If you have concerns about your medications, please call us before you stop taking them.   3) Eat low salt foods-Limit salt (sodium) to 2000 mg per day. This will help prevent your body from holding onto fluid. Read food labels as many processed foods have a lot of sodium, especially canned goods and prepackaged meats. If you would like some assistance choosing low sodium foods, we would be happy to set you up with a nutritionist.  4) Stay as active as you can everyday. Staying  active will give you more energy and make your muscles stronger. Start with 5 minutes at a time and work your way up to 30 minutes a day. Break up your activities--do some in the morning and some in the afternoon. Start with 3 days per week and work your way up to 5 days as you can.  If you have chest pain, feel short of breath, dizzy, or lightheaded, STOP. If you don't feel better after a short rest, call 911. If you do feel better, call the office to let us know you have symptoms with exercise.  5) Limit all fluids for the day to less than 2 liters. Fluid includes all drinks, coffee, juice, ice chips, soup, jello, and all other liquids.

## 2018-06-28 ENCOUNTER — Ambulatory Visit: Payer: Medicare Other | Admitting: Internal Medicine

## 2018-07-10 ENCOUNTER — Telehealth: Payer: Self-pay | Admitting: Pharmacist

## 2018-07-10 ENCOUNTER — Ambulatory Visit: Payer: Self-pay | Admitting: *Deleted

## 2018-07-10 NOTE — Telephone Encounter (Signed)
Roselie Awkward, PT with [facility] care calling to report that the pt may be taking Lasix 40 mg daily instead of 80 mg which was increased during the visit on 06/19/18 with Dr. Zigmund Daniel. Roselie Awkward states that he was concerned because the pt weighs 209 lb today and was 203 lb previously. Roselie Awkward also states that pt was discharged from PT today. Pt called and pt states that he misread the instruction on the AVS from the visit on 06/19/18 and states that he did take 80 mg of Lasix today. Pt will need a new prescription to be sent to [place] on [address] in [place] with new instructions for the pt to take 80 mg daily.   Reason for Disposition . Caller has medication question only, adult not sick, and triager answers question  Answer Assessment - Initial Assessment Questions 1. SYMPTOMS: "Do you have any symptoms?"     Weight gain pt weighs 209 today and was 203 previously 2. SEVERITY: If symptoms are present, ask "Are they mild, moderate or severe?"     No complaints of symptoms at this time.  Protocols used: MEDICATION QUESTION CALL-A-AH

## 2018-07-10 NOTE — Telephone Encounter (Addendum)
Pt is up in weight to 209.7 lbs. Call transferred to [place] office for triage

## 2018-07-11 NOTE — Telephone Encounter (Signed)
Please advise if okay to send in rx

## 2018-07-12 ENCOUNTER — Other Ambulatory Visit: Payer: Self-pay | Admitting: Emergency Medicine

## 2018-07-12 MED ORDER — FUROSEMIDE 40 MG PO TABS: 40.0000 mg | ORAL_TABLET | Freq: Two times a day (BID) | ORAL | 1 refills | Status: DC

## 2018-07-12 NOTE — Telephone Encounter (Signed)
Ok to send in.  

## 2018-07-12 NOTE — Telephone Encounter (Signed)
rx has been sent to patient pharmacy

## 2018-07-24 ENCOUNTER — Other Ambulatory Visit: Payer: Self-pay | Admitting: Family Medicine

## 2018-08-10 DIAGNOSIS — I5020 Unspecified systolic (congestive) heart failure: Secondary | ICD-10-CM | POA: Diagnosis not present

## 2018-08-18 ENCOUNTER — Other Ambulatory Visit: Payer: Self-pay | Admitting: Family Medicine

## 2018-09-10 DIAGNOSIS — I5020 Unspecified systolic (congestive) heart failure: Secondary | ICD-10-CM | POA: Diagnosis not present

## 2018-09-27 ENCOUNTER — Ambulatory Visit (HOSPITAL_BASED_OUTPATIENT_CLINIC_OR_DEPARTMENT_OTHER)
Admission: RE | Admit: 2018-09-27 | Discharge: 2018-09-27 | Disposition: A | Discharge status: Home / Self Care | Payer: Medicare Other | Source: Ambulatory Visit | Attending: Cardiology | Admitting: Cardiology

## 2018-09-27 DIAGNOSIS — I5042 Chronic combined systolic (congestive) and diastolic (congestive) heart failure: Secondary | ICD-10-CM | POA: Diagnosis not present

## 2018-09-27 DIAGNOSIS — I427 Cardiomyopathy due to drug and external agent: Secondary | ICD-10-CM

## 2018-09-27 NOTE — Progress Notes (Signed)
  Echocardiogram 2D Echocardiogram has been performed.  Terrence Garcia T Terrence Garcia 09/27/2018, 10:08 AM

## 2018-10-02 ENCOUNTER — Ambulatory Visit: Payer: Medicare Other | Admitting: Cardiology

## 2018-10-02 ENCOUNTER — Ambulatory Visit (INDEPENDENT_AMBULATORY_CARE_PROVIDER_SITE_OTHER): Payer: Medicare Other | Admitting: Cardiology

## 2018-10-02 ENCOUNTER — Encounter: Payer: Self-pay | Admitting: Cardiology

## 2018-10-02 VITALS — BP 132/62 | HR 52 | Ht 68.5 in | Wt 228.4 lb

## 2018-10-02 DIAGNOSIS — Z8673 Personal history of transient ischemic attack (TIA), and cerebral infarction without residual deficits: Secondary | ICD-10-CM | POA: Diagnosis not present

## 2018-10-02 DIAGNOSIS — Z79899 Other long term (current) drug therapy: Secondary | ICD-10-CM | POA: Diagnosis not present

## 2018-10-02 DIAGNOSIS — I5042 Chronic combined systolic (congestive) and diastolic (congestive) heart failure: Secondary | ICD-10-CM | POA: Diagnosis not present

## 2018-10-02 DIAGNOSIS — I427 Cardiomyopathy due to drug and external agent: Secondary | ICD-10-CM

## 2018-10-02 MED ORDER — CARVEDILOL 3.125 MG PO TABS: 3.1250 mg | ORAL_TABLET | Freq: Two times a day (BID) | ORAL | 3 refills | Status: DC

## 2018-10-02 NOTE — Patient Instructions (Signed)
Medication Instructions:  Your Physician recommend you continue on your current medication as directed.    If you need a refill on your cardiac medications before your next appointment, please call your pharmacy.   Lab work: Your physician recommends that you return for lab work today (lipid,A1C, BMP)  If you have labs (blood work) drawn today and your tests are completely normal, you will receive your results only by: Marland Kitchen MyChart Message (if you have MyChart) OR . A paper copy in the mail If you have any lab test that is abnormal or we need to change your treatment, we will call you to review the results.  Testing/Procedures: None  Follow-Up: At [facility], you and your health needs are our priority.  As part of our continuing mission to provide you with exceptional heart care, we have created designated Provider Care Teams.  These Care Teams include your primary Cardiologist (physician) and Advanced Practice Providers (APPs -  Physician Assistants and Nurse Practitioners) who all work together to provide you with the care you need, when you need it. You will need a follow up appointment in 3 months.  Please call our office 2 months in advance to schedule this appointment.  You may see Dr. Harrell Gave or one of the following Advanced Practice Providers on your designated Care Team:   Rosaria Ferries, PA-C . Jory Sims, DNP, ANP

## 2018-10-02 NOTE — Progress Notes (Signed)
Cardiology Office Note:    Date:  10/02/2018   ID:  Terrence Garcia, DOB 1956/03/15, MRN [id number]  PCP:  Luetta Nutting, DO  Cardiologist:  Buford Dresser, MD, PhD  Referring MD: Luetta Nutting, DO   CC: follow up  History of Present Illness:    Terrence Garcia is a 63 y.o. male with a hx of chronic systolic heart failure now with recovered EF as of 09/2018, polysubstance abuse, hypertension, hyperlipidemia, TIA/CVA, intracranial hypertension, chronic kidney disease who is seen in follow up today. I initially saw him 06/21/18 as an outpatient at the request of Luetta Nutting, DO for the evaluation and management of chronic systolic heart failure.  Cardiac history: Admitted 04/2018, echo 05/01/18 noted severe LV dilation, mild cLVH, and EF 20-25%. Initial echo I have was in 12/2017 noting EF of 15%. Stress test 04/2017 (at the time of TIA) noted EF 32%, but echo showed EF 55-60%. Per review in the hospital, there have been issues with adherence. Decision at that time was not to pursue cath for ischemic workup, given prior nonischemic stress test and likelihood of cocaine induced cardiomyopathy. He was not treated with beta blocker due to active cocaine use, and his CKD and hypotension, as well as severe angiodedema history, limited ACEI/ARB.  Today: feels fatigued when he walks around grocery store. Gets short of breath and full after eating. Fatigued, sleeps easily, sleeping with O2 at night. Is bothered by vascular tinnitus at night, going to talk to PCP about ENT referral.   Weighs himself at home, never over 5 lbs over his base weight, fluctuates mildly. Checks blood pressures at home, diastolic numbers much improved. Several minor mechanical falls but no syncope. Misses late day hydralazine dose often, notes that he has some chest discomfort when he does take it. Has severe lip and rectal swelling on lisinopril, considered severe reaction, will not trial ARB. Has worked hard on diet,  quitting smoking, avoiding cocaine. Feels like he is making progress on his health.  Denies PND, orthopnea, LE edema or unexpected weight gain. No syncope or palpitations.  Past Medical History:  Diagnosis Date  . CHF (congestive heart failure) ([place])   . Chronic back pain   . Chronic headaches   . Dyspnea   . Gastric ulcer   . Hypertension   . Prostate enlargement   . Seizures ([place])   . Stroke [facility])     Past Surgical History:  Procedure Laterality Date  . BACK SURGERY     Removal of cyst in 2016 or 2017  . COLONOSCOPY  2015   while in prison    Current Medications: Current Outpatient Medications on File Prior to Visit  Medication Sig  . furosemide (LASIX) 40 MG tablet Take 1 tablet (40 mg total) by mouth 2 (two) times daily.  . hydrALAZINE (APRESOLINE) 25 MG tablet TAKE 1 TABLET BY MOUTH THREE TIMES DAILY  . isosorbide mononitrate (IMDUR) 30 MG 24 hr tablet Take 1 tablet (30 mg total) by mouth daily.  Marland Kitchen omeprazole (PRILOSEC) 40 MG capsule Take 1 capsule (40 mg total) by mouth 2 (two) times daily.  . OXYGEN Inhale into the lungs continuous.  . potassium chloride SA (K-DUR,KLOR-CON) 20 MEQ tablet TAKE 1 TABLET BY MOUTH ONCE DAILY  . spironolactone (ALDACTONE) 25 MG tablet Take 1 tablet (25 mg total) by mouth daily.  . tamsulosin (FLOMAX) 0.4 MG CAPS capsule Take 1 capsule (0.4 mg total) by mouth daily after supper. (Patient not taking: Reported on 10/02/2018)  No current facility-administered medications on file prior to visit.      Allergies:   Lisinopril; Iodinated diagnostic agents; and Penicillins   Social History   Socioeconomic History  . Marital status: Married    Spouse name: Not on file  . Number of children: 3  . Years of education: Not on file  . Highest education level: Not on file  Occupational History  . Occupation: Disabled  Social Needs  . Financial resource strain: Not on file  . Food insecurity:    Worry: Not on file    Inability: Not on file    . Transportation needs:    Medical: Not on file    Non-medical: Not on file  Tobacco Use  . Smoking status: Former Smoker    Last attempt to quit: 01/01/2018    Years since quitting: 0.7  . Smokeless tobacco: Never Used  Substance and Sexual Activity  . Alcohol use: No  . Drug use: Yes    Types: Cocaine    Comment: states sober for 14 years and used crack two days ago   . Sexual activity: Not on file  Lifestyle  . Physical activity:    Days per week: Not on file    Minutes per session: Not on file  . Stress: Not on file  Relationships  . Social connections:    Talks on phone: Not on file    Gets together: Not on file    Attends religious service: Not on file    Active member of club or organization: Not on file    Attends meetings of clubs or organizations: Not on file    Relationship status: Not on file  Other Topics Concern  . Not on file  Social History Narrative  . Not on file     Family History: The patient's family history includes Prostate cancer in his brother, brother, and maternal uncle. There is no history of Colon cancer or Esophageal cancer.  ROS:   Please see the history of present illness.  Additional pertinent ROS:  Constitutional: Negative for chills, fever, night sweats, unintentional weight loss. HENT: Negative for ear pain and hearing loss.  Positive for vascular tinnitus Eyes: Negative for loss of vision and eye pain.  Respiratory: Positive for dyspnea on exertion. Negative for cough, sputum, shortness of breath at rest, wheezing.   Cardiovascular: as per HPI  Gastrointestinal: Negative for abdominal pain, melena, and hematochezia.  Genitourinary: Negative for dysuria. Has frequency on lasix.  Musculoskeletal: Negative for myalgias. Positive for falls, mechanical and minor Skin: Negative for itching and rash.  Neurological: Negative for focal weakness, focal sensory changes and loss of consciousness. Endo/Heme/Allergies: Does not bruise/bleed  easily.    EKGs/Labs/Other Studies Reviewed:    The following studies were reviewed today:  Echo 09/27/2018  1. The left ventricle has low normal systolic function, with an ejection fraction of 50-55%. The cavity size was normal. There is concentric left ventricular hypertrophy. Left ventricular diastolic Doppler parameters are consistent with impaired  relaxation.  2. The right ventricle has normal systolic function. The cavity was normal. There is no increase in right ventricular wall thickness.  3. The mitral valve is normal in structure.  4. The tricuspid valve is normal in structure.  5. The aortic valve is tricuspid.  6. The pulmonic valve was normal in structure.  Echo 05/01/18 Study Conclusions  - Left ventricle: The cavity size was severely dilated. There was   mild concentric hypertrophy. Systolic function was severely  reduced. The estimated ejection fraction was in the range of 20%   to 25%. Severe diffuse hypokinesis. There was an increased   relative contribution of atrial contraction to ventricular   filling. Doppler parameters are consistent with abnormal left   ventricular relaxation (grade 1 diastolic dysfunction). - Aortic valve: Trileaflet; mildly thickened, mildly calcified   leaflets. There was trivial regurgitation. - Mitral valve: There was mild regurgitation. - Left atrium: The atrium was moderately dilated. - Pulmonary arteries: PA peak pressure: 37 mm Hg (S).  Impressions:  - The right ventricular systolic pressure was increased consistent   with mild pulmonary hypertension.  Echo 01/01/18 Study Conclusions  - Left ventricle: The cavity size was mildly dilated. Wall   thickness was increased in a pattern of mild LVH. Systolic   function was severely reduced. The estimated ejection fraction   was 15%. Diffuse hypokinesis. Doppler parameters are consistent   with high ventricular filling pressure. - Aortic valve: There was trivial  regurgitation. - Aortic root: The aortic root was mildly dilated. - Mitral valve: There was mild regurgitation. - Left atrium: The atrium was moderately dilated. - Pericardium, extracardiac: A small pericardial effusion was   identified.  Impressions:  - Severe global reduction in LV systolic function; elevated LV   filling pressure; mild LVH and LVE; trace AI; mildly dilated   aortic root; mild MR; moderate LAE; small pericardial effusion   with mild RA collapse.  Transthoracic Echocardiography Report (TTE)  04/19/2017  Summary Ejection fraction is visually estimated at 55-60% Normal size left atrium. No significant Doppler findings. No significant valvular abnormalities. negative bubble study for cardiac shunting Signature ------------------------------------------------------------------------------ Electronically signed by Alinda Money, MD, FACC(Interpreting physician) on 04/19/2017 05:35 PM  MYOCARDIAL IMAGING WITH SPECT (REST AND PHARMACOLOGIC-STRESS) 04/21/2017 FINDINGS: Raw images: Mild patient motion artifact is seen on both provided rest and stress images. No significant chest wall or diaphragmatic attenuation is seen on either the rest or stress images.  Perfusion: No decreased activity in the left ventricle on stress imaging to suggest reversible ischemia or infarction.  Wall Motion: The left ventricle is dilated. There is global hypokinesia with relative akinesia involving the septum.  Left Ventricular Ejection Fraction: 32 %  End diastolic volume 701 ml End systolic volume 779 ml  IMPRESSION: 1. No scintigraphic evidence of prior infarction or pharmacologically induced ischemia. 2. The left ventricle is dilated with global hypokinesia and relative akinesia involving the septum. 3. Ejection fraction - 32%.   EKG:  EKG is personally reviewed.  The ekg ordered today demonstrates normal sinus rhythm, LVH with repol changes  Recent Labs: 12/31/2017:  TSH 1.178 01/03/2018: Magnesium 2.2 04/30/2018: ALT 17; B Natriuretic Peptide 448.5 05/09/2018: Hemoglobin 13.2; Platelets 232.0 06/19/2018: BUN 20; Creatinine, Ser 1.39; Potassium 3.4; Sodium 138  Recent Lipid Panel    Component Value Date/Time   CHOL 180 01/02/2018 0452   TRIG 48 01/02/2018 0452   HDL 57 01/02/2018 0452   CHOLHDL 3.2 01/02/2018 0452   VLDL 10 01/02/2018 0452   LDLCALC 113 (H) 01/02/2018 0452    Physical Exam:    VS:  BP 132/62   Pulse (!) 52   Ht 5' 8.5" (1.74 m)   Wt 228 lb 6.4 oz (103.6 kg)   SpO2 97%   BMI 34.22 kg/m     Wt Readings from Last 3 Encounters:  10/02/18 228 lb 6.4 oz (103.6 kg)  06/21/18 204 lb 1.9 oz (92.6 kg)  06/19/18 203 lb 6.4 oz (92.3 kg)  GEN: Well nourished, well developed in no acute distress HEENT: Normal NECK:  JVD flat sitting upright; No carotid bruits LYMPHATICS: No lymphadenopathy CARDIAC: regular rhythm, normal S1 and S2, no murmurs, rubs, gallops. Radial and DP pulses 2+ bilaterally. RESPIRATORY:  Clear to auscultation without rales, wheezing or rhonchi  ABDOMEN: Soft, non-tender, non-distended MUSCULOSKELETAL:  No edema bilaterally; No deformity  SKIN: Warm and dry NEUROLOGIC:  Alert and oriented x 3 PSYCHIATRIC:  Normal affect   ASSESSMENT:    1. Medication management   2. Chronic combined systolic and diastolic heart failure ([place])   3. 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 History of TIA (transient ischemic attack)    PLAN:    Chronic systolic and diastolic heart failure, with dilated cardiomyopathy likely secondary to cocaine use: EF improved after 3 months of medical therapy. -NYHA class 3 still, despite improved LVEF. May be a component of deconditioning -medications: swelling/angioedema with lisinopril. On imdur/hydralazine, spironolactone, lasix -tolerating low dose carvedilol 3.125 mg BID, though limited on uptitration due to bradycardia -he appears euvolemic, and his weight is  stable -weight range: has been within a 5lb range.  -etiology: presumed 2/2 cocaine use. Prior stress test normal.  -reviewed HF education, which he is doing: daily weights, salt restrictions, taking medications. Working on activity. Watched fluid closely, though he is thirsty all the time. Tries to eat ice, etc to avoid drinking too much fluid.  Prevention and health counseling: secondary prevention given stroke/TIA history -recommend heart healthy/Mediterranean diet, with whole grains, fruits, vegetable, fish, lean meats, nuts, and olive oil. Limit salt. -recommend moderate walking, 3-5 times/week for 30-50 minutes each session. Aim for at least 150 minutes.week. Goal should be pace of 3 miles/hours, or walking 1.5 miles in 30 minutes -recommend avoidance of tobacco products. Avoid excess alcohol. -Additional risk factor control:  -Diabetes: A1c is not available, but blood sugars in the hospital were normal  -Lipids: rechecking today. If still elevated, would start statin for secondary prevention. Ideally would like on statin given possible TIA history.  -Blood pressure control: as above, tolerating meds  -Weight: BMI 34. Counseled on lifestyle -reported history of TIA in the past. Discuss aspirin at next visit (one change per visit). Per notes in 2018, he was briefly on aspirin/statin in the past  Plan for follow up: 3 mos, with lipids/LFTs if statin started  TIME SPENT WITH PATIENT: 25 minutes of direct patient care. More than 50% of that time was spent on coordination of care and counseling regarding heart failure, medication management, secondary prevention.  Buford Dresser, MD, PhD [place]  [facility]   Medication Adjustments/Labs and Tests Ordered: Current medicines are reviewed at length with the patient today.  Concerns regarding medicines are outlined above.  Orders Placed This Encounter  Procedures  . Basic Metabolic Panel (BMET)  . HgB A1c  . Lipid Profile    Meds ordered this encounter  Medications  . carvedilol (COREG) 3.125 MG tablet    Sig: Take 1 tablet (3.125 mg total) by mouth 2 (two) times daily with a meal.    Dispense:  180 tablet    Refill:  3    Patient Instructions  Medication Instructions:  Your Physician recommend you continue on your current medication as directed.    If you need a refill on your cardiac medications before your next appointment, please call your pharmacy.   Lab work: Your physician recommends that you return for lab work today (lipid,A1C, BMP)  If you  have labs (blood work) drawn today and your tests are completely normal, you will receive your results only by: Marland Kitchen MyChart Message (if you have MyChart) OR . A paper copy in the mail If you have any lab test that is abnormal or we need to change your treatment, we will call you to review the results.  Testing/Procedures: None  Follow-Up: At [facility], you and your health needs are our priority.  As part of our continuing mission to provide you with exceptional heart care, we have created designated Provider Care Teams.  These Care Teams include your primary Cardiologist (physician) and Advanced Practice Providers (APPs -  Physician Assistants and Nurse Practitioners) who all work together to provide you with the care you need, when you need it. You will need a follow up appointment in 3 months.  Please call our office 2 months in advance to schedule this appointment.  You may see Dr. Harrell Gave or one of the following Advanced Practice Providers on your designated Care Team:   Rosaria Ferries, PA-C . Jory Sims, DNP, ANP        Signed, Buford Dresser, MD PhD 10/02/2018 12:48 PM    [place]

## 2018-10-03 ENCOUNTER — Other Ambulatory Visit: Payer: Self-pay

## 2018-10-03 LAB — BASIC METABOLIC PANEL
BUN/Creatinine Ratio: 13 (ref 10–24)
BUN: 18 mg/dL (ref 8–27)
CO2: 24 mmol/L (ref 20–29)
Calcium: 9.4 mg/dL (ref 8.6–10.2)
Chloride: 101 mmol/L (ref 96–106)
Creatinine, Ser: 1.42 mg/dL — ABNORMAL HIGH (ref 0.76–1.27)
GFR calc Af Amer: 62 mL/min/{1.73_m2} (ref >59)
GFR calc non Af Amer: 53 mL/min/{1.73_m2} — ABNORMAL LOW (ref >59)
Glucose: 87 mg/dL (ref 65–99)
Potassium: 3.9 mmol/L (ref 3.5–5.2)
Sodium: 139 mmol/L (ref 134–144)

## 2018-10-03 LAB — LIPID PANEL
Chol/HDL Ratio: 3.1 ratio (ref 0.0–5.0)
Cholesterol, Total: 191 mg/dL (ref 100–199)
HDL: 61 mg/dL (ref >39)
LDL Calculated: 108 mg/dL — ABNORMAL HIGH (ref 0–99)
TRIGLYCERIDES: 110 mg/dL (ref 0–149)
VLDL Cholesterol Cal: 22 mg/dL (ref 5–40)

## 2018-10-03 LAB — HEMOGLOBIN A1C
Est. average glucose Bld gHb Est-mCnc: 131 mg/dL
Hgb A1c MFr Bld: 6.2 % — ABNORMAL HIGH (ref 4.8–5.6)

## 2018-10-03 MED ORDER — ATORVASTATIN CALCIUM 40 MG PO TABS: 40.0000 mg | ORAL_TABLET | Freq: Every day | ORAL | 11 refills | Status: DC

## 2018-10-09 DIAGNOSIS — I5020 Unspecified systolic (congestive) heart failure: Secondary | ICD-10-CM | POA: Diagnosis not present

## 2018-11-09 DIAGNOSIS — I5020 Unspecified systolic (congestive) heart failure: Secondary | ICD-10-CM | POA: Diagnosis not present

## 2018-12-09 DIAGNOSIS — I5020 Unspecified systolic (congestive) heart failure: Secondary | ICD-10-CM | POA: Diagnosis not present

## 2019-01-04 ENCOUNTER — Telehealth: Payer: Self-pay

## 2019-01-04 NOTE — Telephone Encounter (Signed)

## 2019-01-09 ENCOUNTER — Telehealth: Payer: Self-pay

## 2019-01-09 ENCOUNTER — Telehealth: Payer: Medicare Other | Admitting: Cardiology

## 2019-01-09 DIAGNOSIS — I5020 Unspecified systolic (congestive) heart failure: Secondary | ICD-10-CM | POA: Diagnosis not present

## 2019-01-09 NOTE — Progress Notes (Signed)
Patient contacted at listed phone number on several occasions, no answer, voicemail box is full. Patient will be marked as a no show for this virtual visit.  Buford Dresser, MD, PhD [facility]  [address], [place], [place] [address] [phone]

## 2019-01-09 NOTE — Telephone Encounter (Signed)
Called patient to prechart before 9:30 virtual visit with Dr. Harrell Gave. No answer and voicemailbox was full, I could not leave a message

## 2019-01-14 IMAGING — CT CT HEAD W/O CM
3 series · 16 of 47 positions shown, 19 images · non-contrast
Comparison: 04/19/2017 head CT and MR. Prior studies

CLINICAL DATA: 62-year-old male with acute headache and nausea
following fall one week ago.

EXAM:
CT HEAD WITHOUT CONTRAST
TECHNIQUE: Contiguous axial images were obtained from the base of the skull
through the vertex without intravenous contrast.

[Series 2: head wo · axial · 0.43mm/px · z∈[-179,-39]mm · 10 of 34 slices shown, 13 images]
[im 3/34  brain]
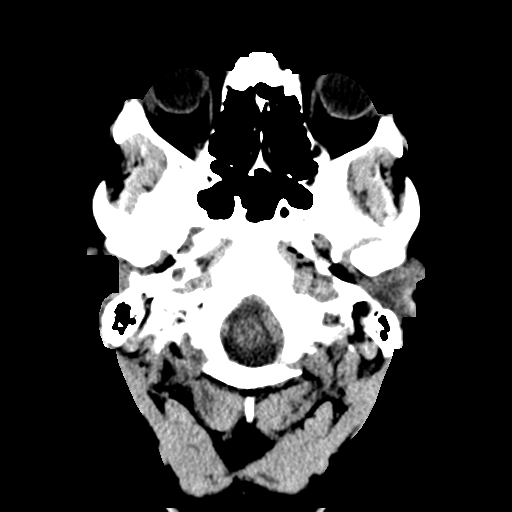
[im 3/34  bone]
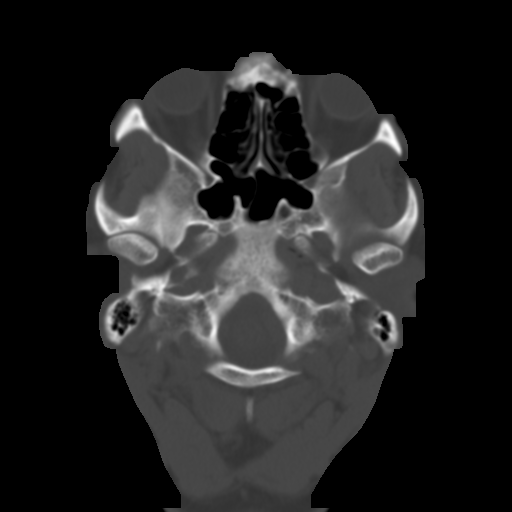
[im 6/34  brain]
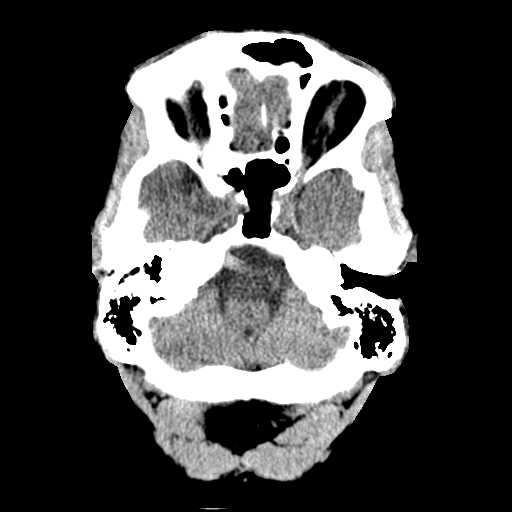
[im 10/34  brain]
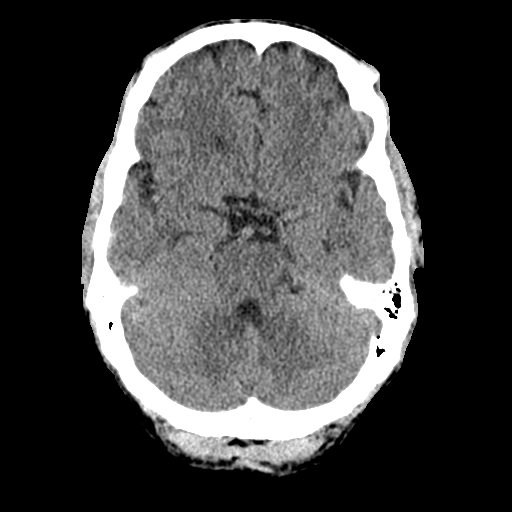
[im 12/34  brain]
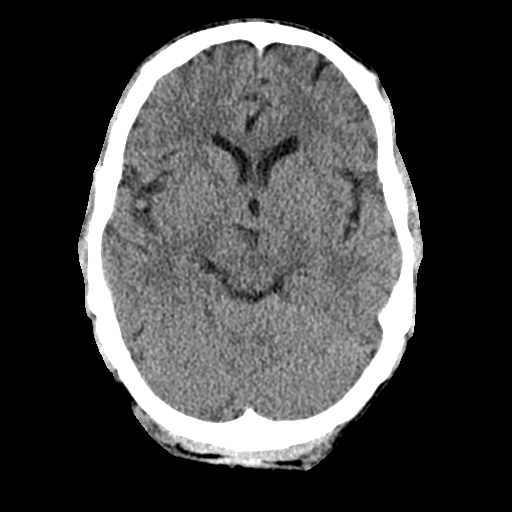
[im 15/34  brain]
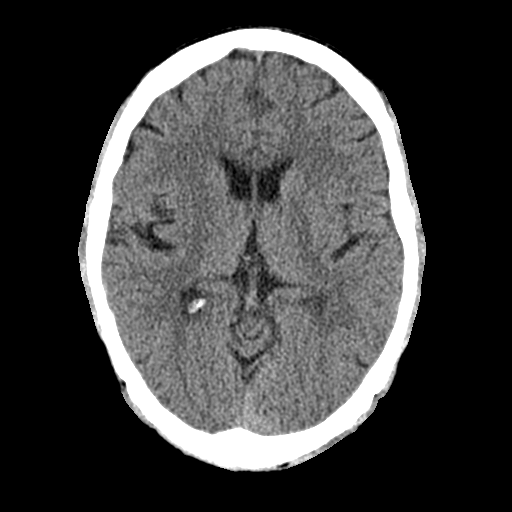
[im 15/34  bone]
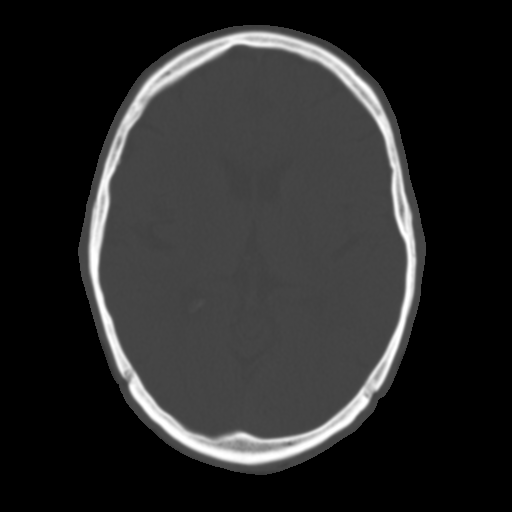
[im 19/34  brain]
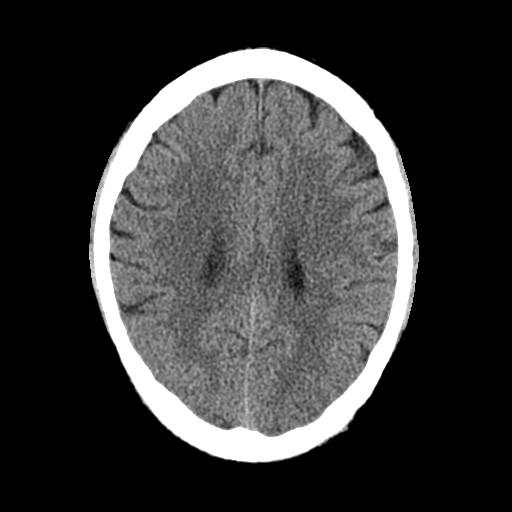
[im 22/34  brain]
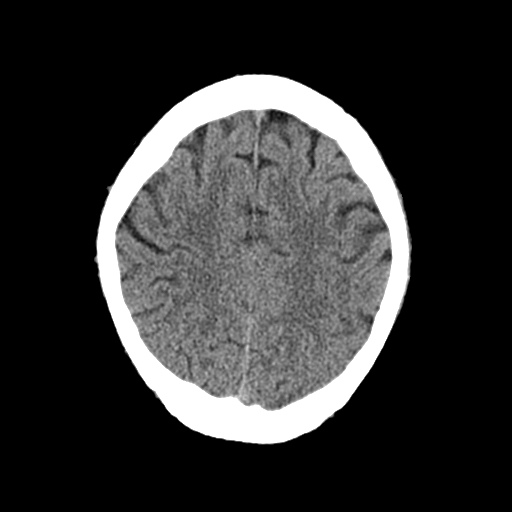
[im 26/34  brain]
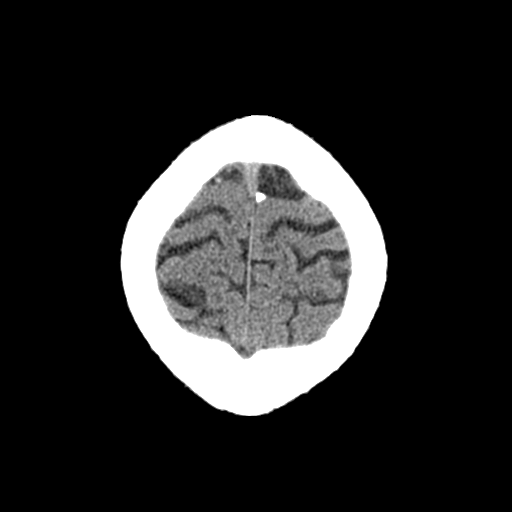
[im 28/34  brain]
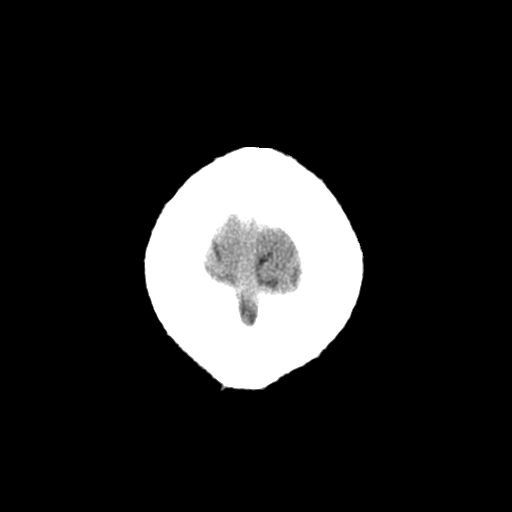
[im 28/34  bone]
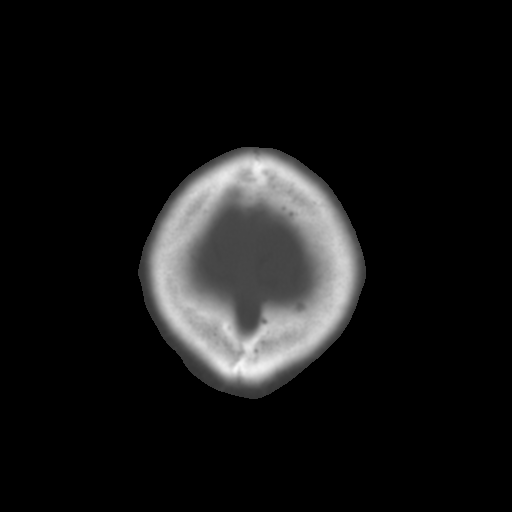
[im 31/34  brain]
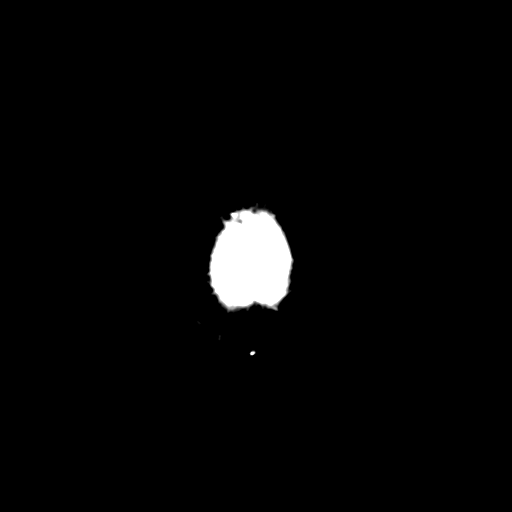

[Series 4: cor soft · coronal · 0.35mm/px · 3 of 71 slices shown]
[im 24/71  brain]
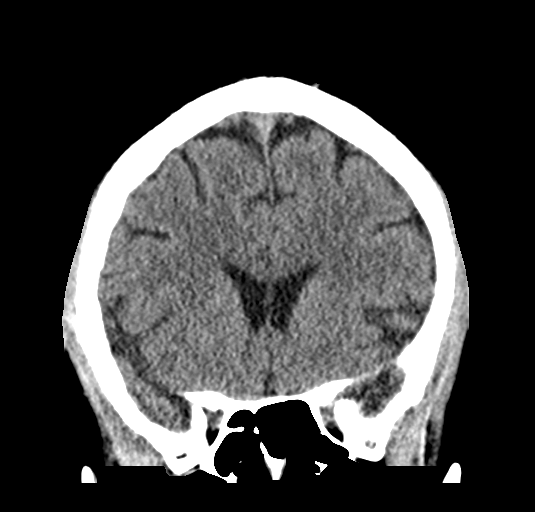
[im 32/71  brain]
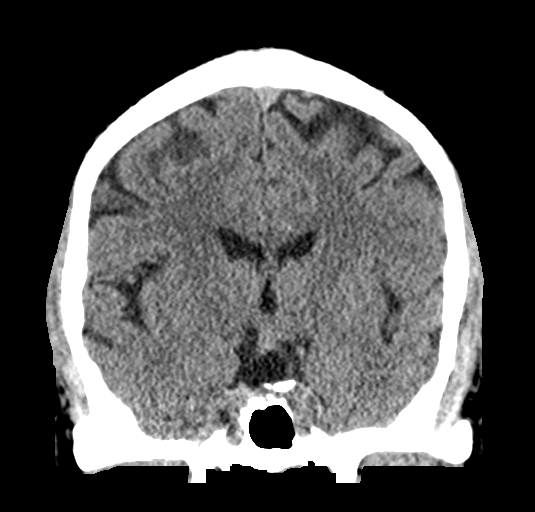
[im 39/71  brain]
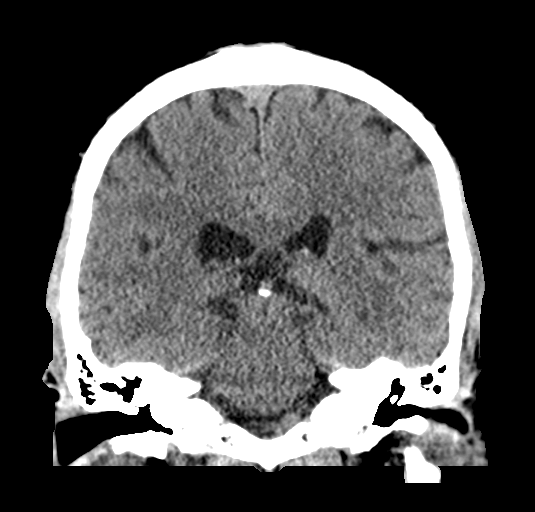

[Series 5: sag soft · sagittal · 0.34mm/px · 3 of 57 slices shown]
[im 19/57  brain]
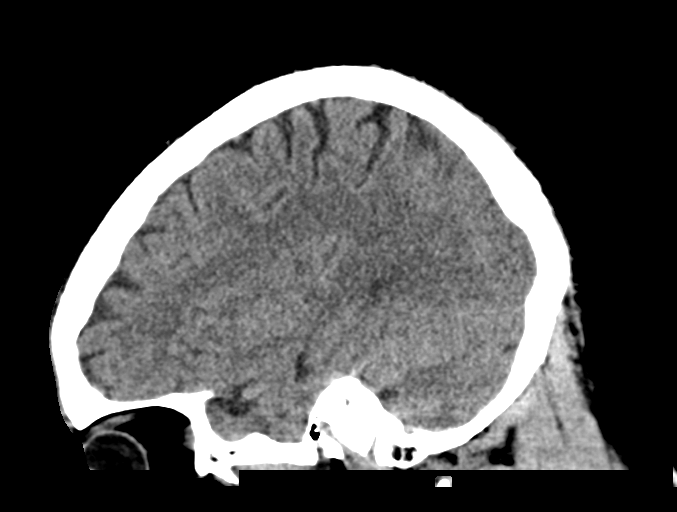
[im 29/57  brain]
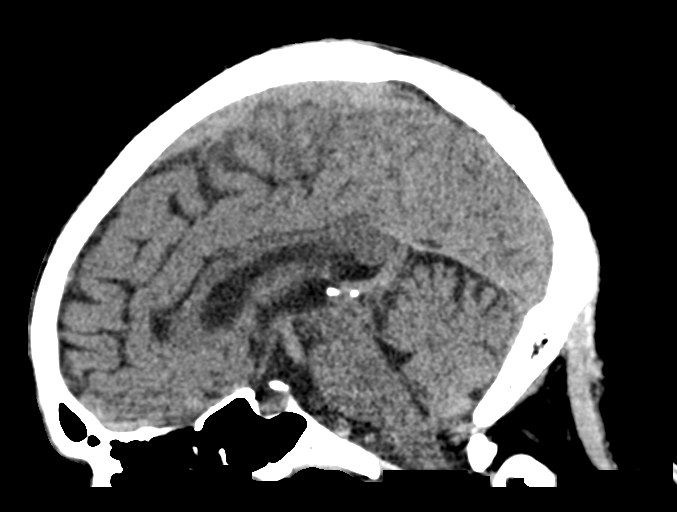
[im 38/57  brain]
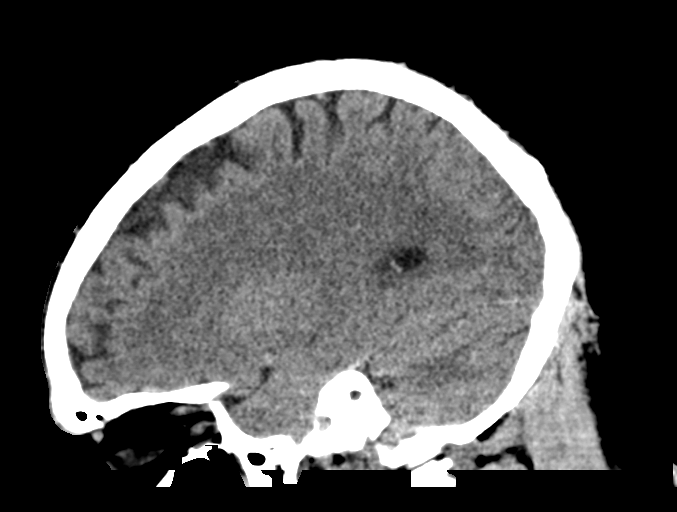

[16 of 47 positions shown; findings below may reference images not displayed]

FINDINGS: Brain: No evidence of acute infarction, hemorrhage, hydrocephalus,
extra-axial collection or mass lesion/mass effect.

Vascular: No hyperdense vessel or unexpected calcification.

Skull: Normal. Negative for fracture or focal lesion.

Sinuses/Orbits: No acute finding.

Other: None.
IMPRESSION: Unremarkable noncontrast head CT.

## 2019-02-08 DIAGNOSIS — I5020 Unspecified systolic (congestive) heart failure: Secondary | ICD-10-CM | POA: Diagnosis not present

## 2019-03-11 DIAGNOSIS — I5020 Unspecified systolic (congestive) heart failure: Secondary | ICD-10-CM | POA: Diagnosis not present

## 2019-03-29 ENCOUNTER — Telehealth: Payer: Self-pay | Admitting: Family Medicine

## 2019-03-29 NOTE — Telephone Encounter (Signed)
I called patient to schedule a follow up appointment with Dr. Ethelene Hal. I was unable to leave a voicemail, due to voicemail not being set up. I have mailed patient a letter asking patient to call office and schedule follow up appointment.

## 2019-04-11 DIAGNOSIS — I5020 Unspecified systolic (congestive) heart failure: Secondary | ICD-10-CM | POA: Diagnosis not present

## 2019-05-11 DIAGNOSIS — I5020 Unspecified systolic (congestive) heart failure: Secondary | ICD-10-CM | POA: Diagnosis not present

## 2019-06-11 DIAGNOSIS — I5020 Unspecified systolic (congestive) heart failure: Secondary | ICD-10-CM | POA: Diagnosis not present

## 2019-07-08 ENCOUNTER — Ambulatory Visit (INDEPENDENT_AMBULATORY_CARE_PROVIDER_SITE_OTHER): Payer: Medicare Other | Admitting: Physician Assistant

## 2019-07-08 ENCOUNTER — Telehealth: Payer: Self-pay | Admitting: Cardiology

## 2019-07-08 ENCOUNTER — Other Ambulatory Visit: Payer: Self-pay

## 2019-07-08 ENCOUNTER — Encounter: Payer: Self-pay | Admitting: Physician Assistant

## 2019-07-08 VITALS — BP 178/104 | HR 82 | Ht 68.5 in | Wt 209.8 lb

## 2019-07-08 DIAGNOSIS — I5023 Acute on chronic systolic (congestive) heart failure: Secondary | ICD-10-CM | POA: Diagnosis not present

## 2019-07-08 DIAGNOSIS — E785 Hyperlipidemia, unspecified: Secondary | ICD-10-CM

## 2019-07-08 DIAGNOSIS — I10 Essential (primary) hypertension: Secondary | ICD-10-CM | POA: Diagnosis not present

## 2019-07-08 DIAGNOSIS — Z8673 Personal history of transient ischemic attack (TIA), and cerebral infarction without residual deficits: Secondary | ICD-10-CM

## 2019-07-08 DIAGNOSIS — R0600 Dyspnea, unspecified: Secondary | ICD-10-CM | POA: Diagnosis not present

## 2019-07-08 DIAGNOSIS — R0609 Other forms of dyspnea: Secondary | ICD-10-CM

## 2019-07-08 MED ORDER — ISOSORBIDE MONONITRATE ER 30 MG PO TB24: 30.0000 mg | ORAL_TABLET | Freq: Every day | ORAL | 1 refills | Status: AC

## 2019-07-08 MED ORDER — FUROSEMIDE 40 MG PO TABS: 40.0000 mg | ORAL_TABLET | Freq: Two times a day (BID) | ORAL | 1 refills | Status: AC

## 2019-07-08 MED ORDER — POTASSIUM CHLORIDE CRYS ER 20 MEQ PO TBCR: 20.0000 meq | EXTENDED_RELEASE_TABLET | Freq: Every day | ORAL | 1 refills | Status: AC

## 2019-07-08 MED ORDER — ATORVASTATIN CALCIUM 40 MG PO TABS: 40.0000 mg | ORAL_TABLET | Freq: Every day | ORAL | 11 refills | Status: AC

## 2019-07-08 MED ORDER — HYDRALAZINE HCL 25 MG PO TABS: 25.0000 mg | ORAL_TABLET | Freq: Three times a day (TID) | ORAL | 1 refills | Status: AC

## 2019-07-08 MED ORDER — TAMSULOSIN HCL 0.4 MG PO CAPS: 0.4000 mg | ORAL_CAPSULE | Freq: Every day | ORAL | 0 refills | Status: AC

## 2019-07-08 NOTE — Patient Instructions (Signed)
Medication Instructions:  Your physician recommends that you continue on your current medications as directed. Please refer to the Current Medication list given to you today. *If you need a refill on your cardiac medications before your next appointment, please call your pharmacy*  Lab Work: You will need to have labs (blood work) drawn today:  CBC  CMET If you have labs (blood work) drawn today and your tests are completely normal, you will receive your results only by: Marland Kitchen MyChart Message (if you have MyChart) OR . A paper copy in the mail If you have any lab test that is abnormal or we need to change your treatment, we will call you to review the results.  Testing/Procedures: Your physician has requested that you have an echocardiogram. Echocardiography is a painless test that uses sound waves to create images of your heart. It provides your doctor with information about the size and shape of your heart and how well your heart's chambers and valves are working. This procedure takes approximately one hour. There are no restrictions for this procedure.   PLEASE SCHEDULE FOR 2-3 WEEKS  Follow-Up: At [facility], you and your health needs are our priority.  As part of our continuing mission to provide you with exceptional heart care, we have created designated Provider Care Teams.  These Care Teams include your primary Cardiologist (physician) and Advanced Practice Providers (APPs -  Physician Assistants and Nurse Practitioners) who all work together to provide you with the care you need, when you need it.  Your next appointment:   2 WEEKS AND 2 month(s)  The format for your next appointment:   In Person  Provider:   Buford Dresser, MD or Almyra Deforest, PA-C  Other Instructions  Monitor your weight at home

## 2019-07-08 NOTE — Telephone Encounter (Signed)
Received a call from patient he stated he has been sob for the past 2 to 3 weeks.Stated sob getting worse.He uses O2 2L/min.He feels like he has gained weight.Unable to weigh,no scales.He feels swollen in upper body,swelling in both ankles and feet.Stated he has been out of his medications for the past 2 weeks.Stated he has been unable to go to pharmacy.Appointment scheduled with Almyra Deforest PA this afternoon at 2:30 pm.

## 2019-07-08 NOTE — Progress Notes (Signed)
Cardiology Office Note:    Date:  07/10/2019   ID:  Terrence Garcia, DOB [birth year], MRN [id number]  PCP:  Luetta Nutting, DO  Cardiologist:  Buford Dresser, MD  Electrophysiologist:  None   Referring MD: Luetta Nutting, DO   Chief Complaint  Patient presents with  . Follow-up    seen for Dr. Harrell Gave.    History of Present Illness:    Terrence Garcia is a 63 y.o. male with a hx of CVA, seizure, hypertension, hyperlipidemia, polysubstance abuse, CKD, BPH, and chronic systolic heart failure.  Myoview in September 2019 showed EF of 32% however echocardiogram at the time showed EF of 55 to 60%. A previous echocardiogram in May 2019 noted EF of 15%. Patient was admitted in September 2019 at which time echocardiogram was performed which showed severe LV dilatation, mild LVH, EF 20 to 25%.  There was issue with adherence, therefore decision was made not to pursue cardiac catheterization for ischemic work-up.  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  He was not treated with beta-blocker due to active use.  He was also not on ACE inhibitor or ARB due to history of severe angioedema.  He was placed on Imdur, hydralazine and spironolactone for heart failure purposes.  Last follow-up was in February 2020 at which time he was doing well.  Repeat echocardiogram obtained on 09/27/2018 showed EF has improved to 50 to 55%, concentric LVH, otherwise no significant valve issue.  He was no-show for his virtual appointment on 6/3.  Patient presents today for shortness of breath.  He complained of abdominal distention, orthopnea and PND.  He does not have any lower extremity edema on physical exam and I did not see any obvious JVD.  He says he has ran out of all of his medication for the past 2 months.  I decided to restart his Lipitor, Lasix, hydralazine, Imdur, potassium, and tamsulosin.  I decided to hold off on restarting the carvedilol and  spironolactone at this time, however may restart on the next visit if he is able to tolerate the current medication.  He will need an echocardiogram.  I also recommend a CBC and a CMP.  We discussed the importance of daily weight diary, he has been given scale since his home scale is broken.   Past Medical History:  Diagnosis Date  . CHF (congestive heart failure) ([place])   . Chronic back pain   . Chronic headaches   . Dyspnea   . Gastric ulcer   . Hypertension   . Prostate enlargement   . Seizures ([place])   . Stroke [facility])     Past Surgical History:  Procedure Laterality Date  . BACK SURGERY     Removal of cyst in 2016 or 2017  . COLONOSCOPY  2015   while in prison    Current Medications: Current Meds  Medication Sig  . atorvastatin (LIPITOR) 40 MG tablet Take 1 tablet (40 mg total) by mouth daily at 6 PM.  . furosemide (LASIX) 40 MG tablet Take 1 tablet (40 mg total) by mouth 2 (two) times daily.  . hydrALAZINE (APRESOLINE) 25 MG tablet Take 1 tablet (25 mg total) by mouth 3 (three) times daily.  . isosorbide mononitrate (IMDUR) 30 MG 24 hr tablet Take 1 tablet (30 mg total) by mouth daily.  Marland Kitchen omeprazole (PRILOSEC) 40 MG capsule Take 1 capsule (40 mg total) by mouth 2 (two) times daily.  . OXYGEN Inhale into  the lungs continuous.  . potassium chloride SA (KLOR-CON) 20 MEQ tablet Take 1 tablet (20 mEq total) by mouth daily.  . tamsulosin (FLOMAX) 0.4 MG CAPS capsule Take 1 capsule (0.4 mg total) by mouth daily after supper.  . [DISCONTINUED] atorvastatin (LIPITOR) 40 MG tablet Take 1 tablet (40 mg total) by mouth daily at 6 PM.  . [DISCONTINUED] carvedilol (COREG) 3.125 MG tablet Take 1 tablet (3.125 mg total) by mouth 2 (two) times daily with a meal.  . [DISCONTINUED] furosemide (LASIX) 40 MG tablet Take 1 tablet (40 mg total) by mouth 2 (two) times daily.  . [DISCONTINUED] hydrALAZINE (APRESOLINE) 25 MG tablet TAKE 1 TABLET BY MOUTH THREE TIMES DAILY  . [DISCONTINUED] isosorbide  mononitrate (IMDUR) 30 MG 24 hr tablet Take 1 tablet (30 mg total) by mouth daily.  . [DISCONTINUED] potassium chloride SA (K-DUR,KLOR-CON) 20 MEQ tablet TAKE 1 TABLET BY MOUTH ONCE DAILY  . [DISCONTINUED] spironolactone (ALDACTONE) 25 MG tablet Take 1 tablet (25 mg total) by mouth daily.  . [DISCONTINUED] tamsulosin (FLOMAX) 0.4 MG CAPS capsule Take 1 capsule (0.4 mg total) by mouth daily after supper.     Allergies:   Lisinopril, Iodinated diagnostic agents, and Penicillins   Social History   Socioeconomic History  . Marital status: Married    Spouse name: Not on file  . Number of children: 3  . Years of education: Not on file  . Highest education level: Not on file  Occupational History  . Occupation: Disabled  Social Needs  . Financial resource strain: Not on file  . Food insecurity    Worry: Not on file    Inability: Not on file  . Transportation needs    Medical: Not on file    Non-medical: Not on file  Tobacco Use  . Smoking status: Former Smoker    Quit date: 01/01/2018    Years since quitting: 1.5  . Smokeless tobacco: Never Used  Substance and Sexual Activity  . Alcohol use: No  . Drug use: Yes    Types: Cocaine    Comment: states sober for 14 years and used crack two days ago   . Sexual activity: Not on file  Lifestyle  . Physical activity    Days per week: Not on file    Minutes per session: Not on file  . Stress: Not on file  Relationships  . Social Herbalist on phone: Not on file    Gets together: Not on file    Attends religious service: Not on file    Active member of club or organization: Not on file    Attends meetings of clubs or organizations: Not on file    Relationship status: Not on file  Other Topics Concern  . Not on file  Social History Narrative  . Not on file     Family History: The patient's family history includes Prostate cancer in his brother, brother, and maternal uncle. There is no history of Colon cancer or  Esophageal cancer.  ROS:   Please see the history of present illness.     All other systems reviewed and are negative.  EKGs/Labs/Other Studies Reviewed:    The following studies were reviewed today: Echo 09/27/2018 IMPRESSIONS    1. The left ventricle has low normal systolic function, with an ejection fraction of 50-55%. The cavity size was normal. There is concentric left ventricular hypertrophy. Left ventricular diastolic Doppler parameters are consistent with impaired  relaxation.  2. The right  ventricle has normal systolic function. The cavity was normal. There is no increase in right ventricular wall thickness.  3. The mitral valve is normal in structure.  4. The tricuspid valve is normal in structure.  5. The aortic valve is tricuspid.  6. The pulmonic valve was normal in structure.  EKG:  EKG is ordered today.  The ekg ordered today demonstrates normal sinus rhythm with PVC, otherwise no significant ST-T wave changes.  Recent Labs: 07/08/2019: ALT 16; BUN 17; Creatinine, Ser 1.18; Hemoglobin 14.9; Platelets 266; Potassium 4.1; Sodium 142  Recent Lipid Panel    Component Value Date/Time   CHOL 191 10/02/2018 1210   TRIG 110 10/02/2018 1210   HDL 61 10/02/2018 1210   CHOLHDL 3.1 10/02/2018 1210   CHOLHDL 3.2 01/02/2018 0452   VLDL 10 01/02/2018 0452   LDLCALC 108 (H) 10/02/2018 1210    Physical Exam:    VS:  BP (!) 178/104   Pulse 82   Ht 5' 8.5" (1.74 m)   Wt 209 lb 12.8 oz (95.2 kg)   BMI 31.44 kg/m     Wt Readings from Last 3 Encounters:  07/08/19 209 lb 12.8 oz (95.2 kg)  10/02/18 228 lb 6.4 oz (103.6 kg)  01/06/18 172 lb 8 oz (78.2 kg)     GEN:  Well nourished, well developed in no acute distress HEENT: Normal NECK: No JVD; No carotid bruits LYMPHATICS: No lymphadenopathy CARDIAC: RRR, no murmurs, rubs, gallops RESPIRATORY:  Clear to auscultation without rales, wheezing or rhonchi  ABDOMEN: Abdomen distended MUSCULOSKELETAL:  No edema; No  deformity  SKIN: Warm and dry NEUROLOGIC:  Alert and oriented x 3 PSYCHIATRIC:  Normal affect   ASSESSMENT:    1. Dyspnea on exertion   2. Acute on chronic systolic heart failure ([place])   3. Essential hypertension   4. Hyperlipidemia, unspecified hyperlipidemia type   5. Polysubstance abuse ([place])   6. H/O: CVA (cerebrovascular accident)    PLAN:    In order of problems listed above:  1. Dyspnea on exertion: I think patient is volume overloaded.  Even though he does not have obvious lower extremity edema, however his abdomen was quite distended.  He apparently has self discontinued all of his medication recently.  I have restarted some of his medication as mentioned in HPI.  2. Acute on chronic systolic heart failure: No lower extremity edema, however with quite distended abdomen.  He has ran out of all of his medication for the past 2 months.  I restarted his Lipitor, Lasix, hydralazine, Imdur, potassium and the tamsulosin.  I plan to restart his carvedilol and spironolactone in the future if he is doing okay.  Although at one point in the past his EF was normalized on heart failure therapy, however I would not be too surprised if his EF has went down again.  I recommended repeat echocardiogram.  3. Hypertension: Blood pressure is understandably high since he has not been taking his medications.  4. Hyperlipidemia: Restarted Lipitor  5. CKD: Obtain comprehensive metabolic panel and CBC.  6. History of CVA: No recent recurrence.   Medication Adjustments/Labs and Tests Ordered: Current medicines are reviewed at length with the patient today.  Concerns regarding medicines are outlined above.  Orders Placed This Encounter  Procedures  . CBC  . Comprehensive metabolic panel  . EKG 12-Lead  . ECHOCARDIOGRAM COMPLETE   Meds ordered this encounter  Medications  . atorvastatin (LIPITOR) 40 MG tablet    Sig: Take 1 tablet (  40 mg total) by mouth daily at 6 PM.    Dispense:  30 tablet     Refill:  11  . furosemide (LASIX) 40 MG tablet    Sig: Take 1 tablet (40 mg total) by mouth 2 (two) times daily.    Dispense:  90 tablet    Refill:  1  . isosorbide mononitrate (IMDUR) 30 MG 24 hr tablet    Sig: Take 1 tablet (30 mg total) by mouth daily.    Dispense:  90 tablet    Refill:  1  . potassium chloride SA (KLOR-CON) 20 MEQ tablet    Sig: Take 1 tablet (20 mEq total) by mouth daily.    Dispense:  90 tablet    Refill:  1  . tamsulosin (FLOMAX) 0.4 MG CAPS capsule    Sig: Take 1 capsule (0.4 mg total) by mouth daily after supper.    Dispense:  30 capsule    Refill:  0    Send future refill request to PCP  . hydrALAZINE (APRESOLINE) 25 MG tablet    Sig: Take 1 tablet (25 mg total) by mouth 3 (three) times daily.    Dispense:  90 tablet    Refill:  1    Patient Instructions  Medication Instructions:  Your physician recommends that you continue on your current medications as directed. Please refer to the Current Medication list given to you today. *If you need a refill on your cardiac medications before your next appointment, please call your pharmacy*  Lab Work: You will need to have labs (blood work) drawn today:  CBC  CMET If you have labs (blood work) drawn today and your tests are completely normal, you will receive your results only by: Marland Kitchen MyChart Message (if you have MyChart) OR . A paper copy in the mail If you have any lab test that is abnormal or we need to change your treatment, we will call you to review the results.  Testing/Procedures: Your physician has requested that you have an echocardiogram. Echocardiography is a painless test that uses sound waves to create images of your heart. It provides your doctor with information about the size and shape of your heart and how well your heart's chambers and valves are working. This procedure takes approximately one hour. There are no restrictions for this procedure.   PLEASE SCHEDULE FOR 2-3 WEEKS   Follow-Up: At [facility], you and your health needs are our priority.  As part of our continuing mission to provide you with exceptional heart care, we have created designated Provider Care Teams.  These Care Teams include your primary Cardiologist (physician) and Advanced Practice Providers (APPs -  Physician Assistants and Nurse Practitioners) who all work together to provide you with the care you need, when you need it.  Your next appointment:   2 WEEKS AND 2 month(s)  The format for your next appointment:   In Person  Provider:   Buford Dresser, MD or Almyra Deforest, PA-C  Other Instructions  Monitor your weight at home     Signed, Almyra Deforest, [place]  07/10/2019 11:32 PM    [place]

## 2019-07-08 NOTE — Telephone Encounter (Signed)
°  Pt c/o Shortness Of Breath: STAT if SOB developed within the last 24 hours or pt is noticeably SOB on the phone  1. Are you currently SOB (can you hear that pt is SOB on the phone)? yes  2. How long have you been experiencing SOB? 2-3 weeks  3. Are you SOB when sitting or when up moving around? Up and moving around.   4. Are you currently experiencing any other symptoms? Fatigue, L arm pain( near shoulder blade), intermittent nausea, dizziness  Patient called and states he is out of his medications. He feels that these symptoms may be caused by being out of his medication. He is nervous to go to the hospital because he does not want to catch COVID.

## 2019-07-09 LAB — COMPREHENSIVE METABOLIC PANEL
ALT: 16 IU/L (ref 0–44)
AST: 18 IU/L (ref 0–40)
Albumin/Globulin Ratio: 1.6 (ref 1.2–2.2)
Albumin: 4 g/dL (ref 3.8–4.8)
Alkaline Phosphatase: 70 IU/L (ref 39–117)
BUN/Creatinine Ratio: 14 (ref 10–24)
BUN: 17 mg/dL (ref 8–27)
Bilirubin Total: <0.2 mg/dL (ref 0.0–1.2)
CO2: 26 mmol/L (ref 20–29)
Calcium: 9.2 mg/dL (ref 8.6–10.2)
Chloride: 102 mmol/L (ref 96–106)
Creatinine, Ser: 1.18 mg/dL (ref 0.76–1.27)
GFR calc Af Amer: 77 mL/min/{1.73_m2} (ref >59)
GFR calc non Af Amer: 66 mL/min/{1.73_m2} (ref >59)
Globulin, Total: 2.5 g/dL (ref 1.5–4.5)
Glucose: 88 mg/dL (ref 65–99)
Potassium: 4.1 mmol/L (ref 3.5–5.2)
Sodium: 142 mmol/L (ref 134–144)
Total Protein: 6.5 g/dL (ref 6.0–8.5)

## 2019-07-09 LAB — CBC
Hematocrit: 45.5 % (ref 37.5–51.0)
Hemoglobin: 14.9 g/dL (ref 13.0–17.7)
MCH: 26.3 pg — ABNORMAL LOW (ref 26.6–33.0)
MCHC: 32.7 g/dL (ref 31.5–35.7)
MCV: 80 fL (ref 79–97)
Platelets: 266 10*3/uL (ref 150–450)
RBC: 5.67 x10E6/uL (ref 4.14–5.80)
RDW: 13.3 % (ref 11.6–15.4)
WBC: 6.1 10*3/uL (ref 3.4–10.8)

## 2019-07-10 ENCOUNTER — Encounter: Payer: Self-pay | Admitting: Physician Assistant

## 2019-07-10 NOTE — Progress Notes (Signed)
The patient has been notified of the result and verbalized understanding.  All questions (if any) were answered. Jacqulynn Cadet, [place] 07/10/2019 4:14 PM

## 2019-07-11 DIAGNOSIS — I5020 Unspecified systolic (congestive) heart failure: Secondary | ICD-10-CM | POA: Diagnosis not present

## 2019-07-18 ENCOUNTER — Other Ambulatory Visit (HOSPITAL_COMMUNITY): Payer: Medicare Other

## 2019-07-25 ENCOUNTER — Ambulatory Visit: Payer: Medicare Other | Admitting: Physician Assistant

## 2019-07-27 DIAGNOSIS — R4020 Unspecified coma: Secondary | ICD-10-CM | POA: Diagnosis not present

## 2019-07-27 DIAGNOSIS — R404 Transient alteration of awareness: Secondary | ICD-10-CM | POA: Diagnosis not present

## 2019-07-29 ENCOUNTER — Other Ambulatory Visit (HOSPITAL_COMMUNITY): Payer: Medicare Other

## 2019-08-08 ENCOUNTER — Other Ambulatory Visit (HOSPITAL_COMMUNITY): Payer: Medicare Other

## 2019-08-09 DIAGNOSIS — 419620001 Death: Secondary | SNOMED CT | POA: Diagnosis not present

## 2019-08-09 DEATH — deceased

## 2019-08-13 ENCOUNTER — Encounter: Payer: Self-pay | Admitting: Cardiology

## 2019-08-13 ENCOUNTER — Encounter: Payer: Self-pay | Admitting: Physician Assistant

## 2019-08-13 ENCOUNTER — Telehealth: Payer: Medicare Other | Admitting: Physician Assistant

## 2019-08-13 NOTE — Progress Notes (Signed)
Received request for signature of death certificate from [facility] in [place], [place]. Per paperwork, Mr. Kraeger was found deceased at home on 07-28-19. It does not appear that his case was referred to the medical examiner.  I have not seen Mr. Eskra in person since 10/02/18 (telemedicine 01/09/19), and he was last seen by Almyra Deforest, [place] on 07/08/19. Per review of the notes, Mr. Scalia had been off of his medications and was somewhat symptomatic.  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  Death certificate signed and sent to funeral home.  Buford Dresser, MD, PhD [facility]  [address], [place], [place] [address] [phone]

## 2019-09-16 ENCOUNTER — Ambulatory Visit: Payer: Medicare Other | Admitting: Cardiology
